# Patient Record
Sex: Male | Born: 1965
Health system: Southern US, Community
[De-identification: ages and names within clinical notes are randomized; demographics above are authoritative.]

## PROBLEM LIST (undated history)

## (undated) DIAGNOSIS — J449 Chronic obstructive pulmonary disease, unspecified: Secondary | ICD-10-CM

## (undated) DIAGNOSIS — I10 Essential (primary) hypertension: Secondary | ICD-10-CM

## (undated) DIAGNOSIS — E119 Type 2 diabetes mellitus without complications: Secondary | ICD-10-CM

## (undated) DIAGNOSIS — I509 Heart failure, unspecified: Secondary | ICD-10-CM

---

## 2010-12-19 DIAGNOSIS — IMO0001 Reserved for inherently not codable concepts without codable children: Secondary | ICD-10-CM | POA: Insufficient documentation

## 2011-10-18 DIAGNOSIS — Z8679 Personal history of other diseases of the circulatory system: Secondary | ICD-10-CM

## 2012-06-30 DIAGNOSIS — E119 Type 2 diabetes mellitus without complications: Secondary | ICD-10-CM

## 2013-06-24 DIAGNOSIS — D539 Nutritional anemia, unspecified: Secondary | ICD-10-CM | POA: Diagnosis present

## 2013-06-30 DIAGNOSIS — Z720 Tobacco use: Secondary | ICD-10-CM | POA: Diagnosis present

## 2015-04-26 ENCOUNTER — Encounter (HOSPITAL_COMMUNITY): Payer: Self-pay | Admitting: Emergency Medicine

## 2015-04-26 ENCOUNTER — Emergency Department (INDEPENDENT_AMBULATORY_CARE_PROVIDER_SITE_OTHER)
Admission: EM | Admit: 2015-04-26 | Discharge: 2015-04-26 | Disposition: A | Discharge status: Home / Self Care | Payer: No Typology Code available for payment source | Source: Home / Self Care | Attending: Family Medicine | Admitting: Family Medicine

## 2015-04-26 DIAGNOSIS — J069 Acute upper respiratory infection, unspecified: Secondary | ICD-10-CM

## 2015-04-26 DIAGNOSIS — IMO0001 Reserved for inherently not codable concepts without codable children: Secondary | ICD-10-CM

## 2015-04-26 DIAGNOSIS — R030 Elevated blood-pressure reading, without diagnosis of hypertension: Secondary | ICD-10-CM

## 2015-04-26 HISTORY — DX: Type 2 diabetes mellitus without complications: E11.9

## 2015-04-26 HISTORY — DX: Essential (primary) hypertension: I10

## 2015-04-26 MED ORDER — BENZONATATE 100 MG PO CAPS: 100.0000 mg | ORAL_CAPSULE | Freq: Three times a day (TID) | ORAL | Status: DC | PRN

## 2015-04-26 MED ORDER — IPRATROPIUM BROMIDE 0.06 % NA SOLN: 2.0000 | Freq: Four times a day (QID) | NASAL | Status: DC

## 2015-04-26 NOTE — ED Provider Notes (Signed)
CSN: [id number]     Arrival date & time 04/26/15  1845 History   First MD Initiated Contact with Patient 04/26/15 2015     Chief Complaint  Patient presents with  . URI   (Consider location/radiation/quality/duration/timing/severity/associated sxs/prior Treatment) HPI Comments: [place] relocated [place].  No PCP or cardiologist in local area Known hx of HTN and heart murmur. reports that he is supposed to be taking Atenolol, Spironolactone, HCTZ and Enalapril, but is currently only taking Atenolol and Spironolactone. Took his daily dose just prior to [facility]. Smoker Works as Engineer, materialssecurity officer  Patient is a 49 y.o. male presenting with URI. The history is provided by the patient.  URI Presenting symptoms: congestion, cough, fatigue, fever and rhinorrhea   Presenting symptoms: no ear pain, no facial pain and no sore throat   Severity:  Mild Onset quality:  Gradual Duration:  2 days Timing:  Constant Progression:  Unchanged Chronicity:  New Associated symptoms: myalgias   Associated symptoms: no headaches   Associated symptoms comment:  +chills, nausea   Past Medical History  Diagnosis Date  . Hypertension   . Diabetes mellitus without complication    History reviewed. No pertinent past surgical history. No family history on file. History  Substance Use Topics  . Smoking status: Never Smoker   . Smokeless tobacco: Not on file  . Alcohol Use: No    Review of Systems  Constitutional: Positive for fever, chills and fatigue.  HENT: Positive for congestion and rhinorrhea. Negative for ear pain and sore throat.   Eyes: Negative.   Respiratory: Positive for cough. Negative for chest tightness and shortness of breath.   Cardiovascular: Negative.   Gastrointestinal: Positive for nausea. Negative for vomiting, abdominal pain, diarrhea and constipation.  Genitourinary: Negative.   Musculoskeletal: Positive for myalgias. Negative for back pain.   Skin: Negative.   Neurological: Negative for dizziness, light-headedness and headaches.    Allergies  Review of patient's allergies indicates no known allergies.  Home Medications   Prior to Admission medications   Medication Sig Start Date End Date Taking? Authorizing Provider  benzonatate (TESSALON) 100 MG capsule Take 1 capsule (100 mg total) by mouth 3 (three) times daily as needed for cough. 04/26/15   Mathis FareJennifer Lee H Latoyna Hird, PA  ipratropium (ATROVENT) 0.06 % nasal spray Place 2 sprays into both nostrils 4 (four) times daily. As needed for nasal congestion 04/26/15   Jess BartersJennifer Lee H Leanna Hamid, PA   BP 194/101 mmHg  Pulse 57  Temp(Src) 98.3 F (36.8 C) (Oral)  Resp 18  SpO2 95% Physical Exam  Constitutional: He is oriented to person, place, and time. He appears well-developed and well-nourished. No distress.  HENT:  Head: Normocephalic and atraumatic.  Right Ear: Hearing, tympanic membrane, external ear and ear canal normal.  Left Ear: Hearing, tympanic membrane, external ear and ear canal normal.  Nose: Nose normal.  Mouth/Throat: Uvula is midline, oropharynx is clear and moist and mucous membranes are normal.  Eyes: Conjunctivae are normal. No scleral icterus.  Neck: Normal range of motion. Neck supple.  Cardiovascular: Normal rate and regular rhythm.   Murmur heard. 2/6 SEM  Pulmonary/Chest: Effort normal and breath sounds normal.  Musculoskeletal: Normal range of motion.  Lymphadenopathy:    He has no cervical adenopathy.  Neurological: He is alert and oriented to person, place, and time.  Skin: Skin is warm and dry.  Psychiatric: He has a normal mood and affect. His behavior is normal.  Nursing note and vitals reviewed.   ED Course  Procedures (including critical care time) Labs Review Labs Reviewed - No data to display  Imaging Review No results found.   MDM   1. URI (upper respiratory infection)   2. Elevated blood pressure     Fluids Rest Tylenol Tessalon Atrovent Take anti-HTN meds as prescribed Find PCP for follow up and routine medical management.     Mathis Fare [place], [place] 04/26/15 [phone]

## 2015-04-26 NOTE — ED Notes (Signed)
C/o cold sx onset Monday Sx include fevers, chills, nauseas, BA, cough, runny nose Alert, no signs of acute distress.

## 2015-04-26 NOTE — Discharge Instructions (Signed)
DASH Eating Plan °DASH stands for "Dietary Approaches to Stop Hypertension." The DASH eating plan is a healthy eating plan that has been shown to reduce high blood pressure (hypertension). Additional health benefits may include reducing the risk of type 2 diabetes mellitus, heart disease, and stroke. The DASH eating plan may also help with weight loss. °WHAT DO I NEED TO KNOW ABOUT THE DASH EATING PLAN? °For the DASH eating plan, you will follow these general guidelines: °· Choose foods with a percent daily value for sodium of less than 5% (as listed on the food label). °· Use salt-free seasonings or herbs instead of table salt or sea salt. °· Check with your health care provider or pharmacist before using salt substitutes. °· Eat lower-sodium products, often labeled as "lower sodium" or "no salt added." °· Eat fresh foods. °· Eat more vegetables, fruits, and low-fat dairy products. °· Choose whole grains. Look for the word "whole" as the first word in the ingredient list. °· Choose fish and skinless chicken or turkey more often than red meat. Limit fish, poultry, and meat to 6 oz (170 g) each day. °· Limit sweets, desserts, sugars, and sugary drinks. °· Choose heart-healthy fats. °· Limit cheese to 1 oz (28 g) per day. °· Eat more home-cooked food and less restaurant, buffet, and fast food. °· Limit fried foods. °· Cook foods using methods other than frying. °· Limit canned vegetables. If you do use them, rinse them well to decrease the sodium. °· When eating at a restaurant, ask that your food be prepared with less salt, or no salt if possible. °WHAT FOODS CAN I EAT? °Seek help from a dietitian for individual calorie needs. °Grains °Whole grain or whole wheat bread. Brown rice. Whole grain or whole wheat pasta. Quinoa, bulgur, and whole grain cereals. Low-sodium cereals. Corn or whole wheat flour tortillas. Whole grain cornbread. Whole grain crackers. Low-sodium crackers. °Vegetables °Fresh or frozen vegetables  (raw, steamed, roasted, or grilled). Low-sodium or reduced-sodium tomato and vegetable juices. Low-sodium or reduced-sodium tomato sauce and paste. Low-sodium or reduced-sodium canned vegetables.  °Fruits °All fresh, canned (in natural juice), or frozen fruits. °Meat and Other Protein Products °Ground beef (85% or leaner), grass-fed beef, or beef trimmed of fat. Skinless chicken or turkey. Ground chicken or turkey. Pork trimmed of fat. All fish and seafood. Eggs. Dried beans, peas, or lentils. Unsalted nuts and seeds. Unsalted canned beans. °Dairy °Low-fat dairy products, such as skim or 1% milk, 2% or reduced-fat cheeses, low-fat ricotta or cottage cheese, or plain low-fat yogurt. Low-sodium or reduced-sodium cheeses. °Fats and Oils °Tub margarines without trans fats. Light or reduced-fat mayonnaise and salad dressings (reduced sodium). Avocado. Safflower, olive, or canola oils. Natural peanut or almond butter. °Other °Unsalted popcorn and pretzels. °The items listed above may not be a complete list of recommended foods or beverages. Contact your dietitian for more options. °WHAT FOODS ARE NOT RECOMMENDED? °Grains °White bread. White pasta. White rice. Refined cornbread. Bagels and croissants. Crackers that contain trans fat. °Vegetables °Creamed or fried vegetables. Vegetables in a cheese sauce. Regular canned vegetables. Regular canned tomato sauce and paste. Regular tomato and vegetable juices. °Fruits °Dried fruits. Canned fruit in light or heavy syrup. Fruit juice. °Meat and Other Protein Products °Fatty cuts of meat. Ribs, chicken wings, bacon, sausage, bologna, salami, chitterlings, fatback, hot dogs, bratwurst, and packaged luncheon meats. Salted nuts and seeds. Canned beans with salt. °Dairy °Whole or 2% milk, cream, half-and-half, and cream cheese. Whole-fat or sweetened yogurt. Full-fat   cheeses or blue cheese. Nondairy creamers and whipped toppings. Processed cheese, cheese spreads, or cheese  curds. Condiments Onion and garlic salt, seasoned salt, table salt, and sea salt. Canned and packaged gravies. Worcestershire sauce. Tartar sauce. Barbecue sauce. Teriyaki sauce. Soy sauce, including reduced sodium. Steak sauce. Fish sauce. Oyster sauce. Cocktail sauce. Horseradish. Ketchup and mustard. Meat flavorings and tenderizers. Bouillon cubes. Hot sauce. Tabasco sauce. Marinades. Taco seasonings. Relishes. Fats and Oils Butter, stick margarine, lard, shortening, ghee, and bacon fat. Coconut, palm kernel, or palm oils. Regular salad dressings. Other Pickles and olives. Salted popcorn and pretzels. The items listed above may not be a complete list of foods and beverages to avoid. Contact your dietitian for more information. WHERE CAN I FIND MORE INFORMATION? National Heart, Lung, and Blood Institute: [email or web address] Document Released: 12/05/2011 Document Revised: 05/02/2014 Document Reviewed: 10/20/2013 [facility] Patient Information 2015 [place], [place]. This information is not intended to replace advice given to you by your health care provider. Make sure you discuss any questions you have with your health care provider.  Blood Pressure Record Sheet Your blood pressure on this visit to the emergency department or clinic is elevated. This does not necessarily mean you have high blood pressure (hypertension), but it does mean that your blood pressure needs to be rechecked. Many times your blood pressure can increase due to illness, pain, anxiety, or other factors. We recommend that you get a series of blood pressure readings done over a period of 5 days. It is best to get a reading in the morning and one in the evening. You should make sure to sit and relax for 1-5 minutes before the reading is taken. Write the readings down and make a follow-up appointment with your health care provider to discuss the results. If there is not a free clinic or a drug store with  a blood-pressure-taking machine near you, you can purchase blood-pressure-taking equipment from a drug store. Having one in the home allows you the convenience of taking your blood pressure while you are home and relaxed.  Your blood pressure in the emergency department or clinic on ________ was ____________________. BLOOD PRESSURE LOG Date: _______________________  a.m. _____________________  p.m. _____________________ Date: _______________________  a.m. _____________________  p.m. _____________________ Date: _______________________  a.m. _____________________  p.m. _____________________ Date: _______________________  a.m. _____________________  p.m. _____________________ Date: _______________________  a.m. _____________________  p.m. _____________________ Document Released: 09/14/2003 Document Revised: 05/02/2014 Document Reviewed: 02/08/2014 [employer] Patient Information 2015 [place], [place]. This information is not intended to replace advice given to you by your health care provider. Make sure you discuss any questions you have with your health care provider.  Hypertension Hypertension, commonly called high blood pressure, is when the force of blood pumping through your arteries is too strong. Your arteries are the blood vessels that carry blood from your heart throughout your body. A blood pressure reading consists of a higher number over a lower number, such as 110/72. The higher number (systolic) is the pressure inside your arteries when your heart pumps. The lower number (diastolic) is the pressure inside your arteries when your heart relaxes. Ideally you want your blood pressure below 120/80. Hypertension forces your heart to work harder to pump blood. Your arteries may become narrow or stiff. Having hypertension puts you at risk for heart disease, stroke, and other problems.  RISK FACTORS Some risk factors for high blood pressure are controllable. Others are not.  Risk  factors you cannot control include:   Race. You may be at  higher risk if you are African American.  Age. Risk increases with age.  Gender. Men are at higher risk than women before age 73 years. After age 32, women are at higher risk than men. Risk factors you can control include:  Not getting enough exercise or physical activity.  Being overweight.  Getting too much fat, sugar, calories, or salt in your diet.  Drinking too much alcohol. SIGNS AND SYMPTOMS Hypertension does not usually cause signs or symptoms. Extremely high blood pressure (hypertensive crisis) may cause headache, anxiety, shortness of breath, and nosebleed. DIAGNOSIS  To check if you have hypertension, your health care provider will measure your blood pressure while you are seated, with your arm held at the level of your heart. It should be measured at least twice using the same arm. Certain conditions can cause a difference in blood pressure between your right and left arms. A blood pressure reading that is higher than normal on one occasion does not mean that you need treatment. If one blood pressure reading is high, ask your health care provider about having it checked again. TREATMENT  Treating high blood pressure includes making lifestyle changes and possibly taking medicine. Living a healthy lifestyle can help lower high blood pressure. You may need to change some of your habits. Lifestyle changes may include:  Following the DASH diet. This diet is high in fruits, vegetables, and whole grains. It is low in salt, red meat, and added sugars.  Getting at least 2 hours of brisk physical activity every week.  Losing weight if necessary.  Not smoking.  Limiting alcoholic beverages.  Learning ways to reduce stress. If lifestyle changes are not enough to get your blood pressure under control, your health care provider may prescribe medicine. You may need to take more than one. Work closely with your health care  provider to understand the risks and benefits. HOME CARE INSTRUCTIONS  Have your blood pressure rechecked as directed by your health care provider.   Take medicines only as directed by your health care provider. Follow the directions carefully. Blood pressure medicines must be taken as prescribed. The medicine does not work as well when you skip doses. Skipping doses also puts you at risk for problems.   Do not smoke.   Monitor your blood pressure at home as directed by your health care provider. SEEK MEDICAL CARE IF:   You think you are having a reaction to medicines taken.  You have recurrent headaches or feel dizzy.  You have swelling in your ankles.  You have trouble with your vision. SEEK IMMEDIATE MEDICAL CARE IF:  You develop a severe headache or confusion.  You have unusual weakness, numbness, or feel faint.  You have severe chest or abdominal pain.  You vomit repeatedly.  You have trouble breathing. MAKE SURE YOU:   Understand these instructions.  Will watch your condition.  Will get help right away if you are not doing well or get worse. Document Released: 12/16/2005 Document Revised: 05/02/2014 Document Reviewed: 10/08/2013 [facility] Patient Information 2015 [place], [place]. This information is not intended to replace advice given to you by your health care provider. Make sure you discuss any questions you have with your health care provider.  Managing Your High Blood Pressure Blood pressure is a measurement of how forceful your blood is pressing against the walls of the arteries. Arteries are muscular tubes within the circulatory system. Blood pressure does not stay the same. Blood pressure rises when you are active, excited, or  nervous; and it lowers during sleep and relaxation. If the numbers measuring your blood pressure stay above normal most of the time, you are at risk for health problems. High blood pressure (hypertension) is a long-term (chronic)  condition in which blood pressure is elevated. A blood pressure reading is recorded as two numbers, such as 120 over 80 (or 120/80). The first, higher number is called the systolic pressure. It is a measure of the pressure in your arteries as the heart beats. The second, lower number is called the diastolic pressure. It is a measure of the pressure in your arteries as the heart relaxes between beats.  Keeping your blood pressure in a normal range is important to your overall health and prevention of health problems, such as heart disease and stroke. When your blood pressure is uncontrolled, your heart has to work harder than normal. High blood pressure is a very common condition in adults because blood pressure tends to rise with age. Men and women are equally likely to have hypertension but at different times in life. Before age 23, men are more likely to have hypertension. After 47 years of age, women are more likely to have it. Hypertension is especially common in African Americans. This condition often has no signs or symptoms. The cause of the condition is usually not known. Your caregiver can help you come up with a plan to keep your blood pressure in a normal, healthy range. BLOOD PRESSURE STAGES Blood pressure is classified into four stages: normal, prehypertension, stage 1, and stage 2. Your blood pressure reading will be used to determine what type of treatment, if any, is necessary. Appropriate treatment options are tied to these four stages:  Normal  Systolic pressure (mm Hg): below 120.  Diastolic pressure (mm Hg): below 80. Prehypertension  Systolic pressure (mm Hg): 120 to 139.  Diastolic pressure (mm Hg): 80 to 89. Stage1  Systolic pressure (mm Hg): 140 to 159.  Diastolic pressure (mm Hg): 90 to 99. Stage2  Systolic pressure (mm Hg): 160 or above.  Diastolic pressure (mm Hg): 100 or above. RISKS RELATED TO HIGH BLOOD PRESSURE Managing your blood pressure is an important  responsibility. Uncontrolled high blood pressure can lead to:  A heart attack.  A stroke.  A weakened blood vessel (aneurysm).  Heart failure.  Kidney damage.  Eye damage.  Metabolic syndrome.  Memory and concentration problems. HOW TO MANAGE YOUR BLOOD PRESSURE Blood pressure can be managed effectively with lifestyle changes and medicines (if needed). Your caregiver will help you come up with a plan to bring your blood pressure within a normal range. Your plan should include the following: Education  Read all information provided by your caregivers about how to control blood pressure.  Educate yourself on the latest guidelines and treatment recommendations. New research is always being done to further define the risks and treatments for high blood pressure. Lifestylechanges  Control your weight.  Avoid smoking.  Stay physically active.  Reduce the amount of salt in your diet.  Reduce stress.  Control any chronic conditions, such as high cholesterol or diabetes.  Reduce your alcohol intake. Medicines  Several medicines (antihypertensive medicines) are available, if needed, to bring blood pressure within a normal range. Communication  Review all the medicines you take with your caregiver because there may be side effects or interactions.  Talk with your caregiver about your diet, exercise habits, and other lifestyle factors that may be contributing to high blood pressure.  See your caregiver regularly.  Your caregiver can help you create and adjust your plan for managing high blood pressure. RECOMMENDATIONS FOR TREATMENT AND FOLLOW-UP  The following recommendations are based on current guidelines for managing high blood pressure in nonpregnant adults. Use these recommendations to identify the proper follow-up period or treatment option based on your blood pressure reading. You can discuss these options with your caregiver.  Systolic pressure of 120 to 139 or  diastolic pressure of 80 to 89: Follow up with your caregiver as directed.  Systolic pressure of 140 to 160 or diastolic pressure of 90 to 100: Follow up with your caregiver within 2 months.  Systolic pressure above 160 or diastolic pressure above 100: Follow up with your caregiver within 1 month.  Systolic pressure above 180 or diastolic pressure above 110: Consider antihypertensive therapy; follow up with your caregiver within 1 week.  Systolic pressure above 200 or diastolic pressure above 120: Begin antihypertensive therapy; follow up with your caregiver within 1 week. Document Released: 09/09/2012 Document Reviewed: 09/09/2012 [facility] Patient Information 2015 [place], [place]. This information is not intended to replace advice given to you by your health care provider. Make sure you discuss any questions you have with your health care provider.  Upper Respiratory Infection, Adult An upper respiratory infection (URI) is also sometimes known as the common cold. The upper respiratory tract includes the nose, sinuses, throat, trachea, and bronchi. Bronchi are the airways leading to the lungs. Most people improve within 1 week, but symptoms can last up to 2 weeks. A residual cough may last even longer.  CAUSES Many different viruses can infect the tissues lining the upper respiratory tract. The tissues become irritated and inflamed and often become very moist. Mucus production is also common. A cold is contagious. You can easily spread the virus to others by oral contact. This includes kissing, sharing a glass, coughing, or sneezing. Touching your mouth or nose and then touching a surface, which is then touched by another person, can also spread the virus. SYMPTOMS  Symptoms typically develop 1 to 3 days after you come in contact with a cold virus. Symptoms vary from person to person. They may include:  Runny nose.  Sneezing.  Nasal congestion.  Sinus irritation.  Sore throat.  Loss of  voice (laryngitis).  Cough.  Fatigue.  Muscle aches.  Loss of appetite.  Headache.  Low-grade fever. DIAGNOSIS  You might diagnose your own cold based on familiar symptoms, since most people get a cold 2 to 3 times a year. Your caregiver can confirm this based on your exam. Most importantly, your caregiver can check that your symptoms are not due to another disease such as strep throat, sinusitis, pneumonia, asthma, or epiglottitis. Blood tests, throat tests, and X-rays are not necessary to diagnose a common cold, but they may sometimes be helpful in excluding other more serious diseases. Your caregiver will decide if any further tests are required. RISKS AND COMPLICATIONS  You may be at risk for a more severe case of the common cold if you smoke cigarettes, have chronic heart disease (such as heart failure) or lung disease (such as asthma), or if you have a weakened immune system. The very young and very old are also at risk for more serious infections. Bacterial sinusitis, middle ear infections, and bacterial pneumonia can complicate the common cold. The common cold can worsen asthma and chronic obstructive pulmonary disease (COPD). Sometimes, these complications can require emergency medical care and may be life-threatening. PREVENTION  The best way to  protect against getting a cold is to practice good hygiene. Avoid oral or hand contact with people with cold symptoms. Wash your hands often if contact occurs. There is no clear evidence that vitamin C, vitamin E, echinacea, or exercise reduces the chance of developing a cold. However, it is always recommended to get plenty of rest and practice good nutrition. TREATMENT  Treatment is directed at relieving symptoms. There is no cure. Antibiotics are not effective, because the infection is caused by a virus, not by bacteria. Treatment may include:  Increased fluid intake. Sports drinks offer valuable electrolytes, sugars, and  fluids.  Breathing heated mist or steam (vaporizer or shower).  Eating chicken soup or other clear broths, and maintaining good nutrition.  Getting plenty of rest.  Using gargles or lozenges for comfort.  Controlling fevers with ibuprofen or acetaminophen as directed by your caregiver.  Increasing usage of your inhaler if you have asthma. Zinc gel and zinc lozenges, taken in the first 24 hours of the common cold, can shorten the duration and lessen the severity of symptoms. Pain medicines may help with fever, muscle aches, and throat pain. A variety of non-prescription medicines are available to treat congestion and runny nose. Your caregiver can make recommendations and may suggest nasal or lung inhalers for other symptoms.  HOME CARE INSTRUCTIONS   Only take over-the-counter or prescription medicines for pain, discomfort, or fever as directed by your caregiver.  Use a warm mist humidifier or inhale steam from a shower to increase air moisture. This may keep secretions moist and make it easier to breathe.  Drink enough water and fluids to keep your urine clear or pale yellow.  Rest as needed.  Return to work when your temperature has returned to normal or as your caregiver advises. You may need to stay home longer to avoid infecting others. You can also use a face mask and careful hand washing to prevent spread of the virus. SEEK MEDICAL CARE IF:   After the first few days, you feel you are getting worse rather than better.  You need your caregiver's advice about medicines to control symptoms.  You develop chills, worsening shortness of breath, or brown or red sputum. These may be signs of pneumonia.  You develop yellow or brown nasal discharge or pain in the face, especially when you bend forward. These may be signs of sinusitis.  You develop a fever, swollen neck glands, pain with swallowing, or white areas in the back of your throat. These may be signs of strep throat. SEEK  IMMEDIATE MEDICAL CARE IF:   You have a fever.  You develop severe or persistent headache, ear pain, sinus pain, or chest pain.  You develop wheezing, a prolonged cough, cough up blood, or have a change in your usual mucus (if you have chronic lung disease).  You develop sore muscles or a stiff neck. Document Released: 06/11/2001 Document Revised: 03/09/2012 Document Reviewed: 03/23/2014 [facility] Patient Information 2015 [place], [place]. This information is not intended to replace advice given to you by your health care provider. Make sure you discuss any questions you have with your health care provider.

## 2019-02-28 ENCOUNTER — Encounter (HOSPITAL_COMMUNITY): Payer: Self-pay | Admitting: Emergency Medicine

## 2019-02-28 ENCOUNTER — Emergency Department (HOSPITAL_COMMUNITY)
Admission: EM | Admit: 2019-02-28 | Discharge: 2019-02-28 | Disposition: A | Discharge status: Home / Self Care | Payer: Self-pay | Attending: Emergency Medicine | Admitting: Emergency Medicine

## 2019-02-28 ENCOUNTER — Other Ambulatory Visit: Payer: Self-pay

## 2019-02-28 ENCOUNTER — Emergency Department (HOSPITAL_COMMUNITY): Payer: Self-pay

## 2019-02-28 DIAGNOSIS — J440 Chronic obstructive pulmonary disease with acute lower respiratory infection: Secondary | ICD-10-CM | POA: Insufficient documentation

## 2019-02-28 DIAGNOSIS — J189 Pneumonia, unspecified organism: Secondary | ICD-10-CM

## 2019-02-28 DIAGNOSIS — I509 Heart failure, unspecified: Secondary | ICD-10-CM | POA: Insufficient documentation

## 2019-02-28 DIAGNOSIS — E119 Type 2 diabetes mellitus without complications: Secondary | ICD-10-CM | POA: Insufficient documentation

## 2019-02-28 DIAGNOSIS — J181 Lobar pneumonia, unspecified organism: Secondary | ICD-10-CM | POA: Insufficient documentation

## 2019-02-28 DIAGNOSIS — I110 Hypertensive heart disease with heart failure: Secondary | ICD-10-CM | POA: Insufficient documentation

## 2019-02-28 HISTORY — DX: Chronic obstructive pulmonary disease, unspecified: J44.9

## 2019-02-28 HISTORY — DX: Heart failure, unspecified: I50.9

## 2019-02-28 LAB — CBC WITH DIFFERENTIAL/PLATELET
ABS IMMATURE GRANULOCYTES: 0.02 10*3/uL (ref 0.00–0.07)
BASOS PCT: 0 %
Basophils Absolute: 0 10*3/uL (ref 0.0–0.1)
Eosinophils Absolute: 0 10*3/uL (ref 0.0–0.5)
Eosinophils Relative: 0 %
HCT: 47.5 % (ref 39.0–52.0)
Hemoglobin: 15.3 g/dL (ref 13.0–17.0)
IMMATURE GRANULOCYTES: 0 %
Lymphocytes Relative: 12 %
Lymphs Abs: 0.9 10*3/uL (ref 0.7–4.0)
MCH: 29.9 pg (ref 26.0–34.0)
MCHC: 32.2 g/dL (ref 30.0–36.0)
MCV: 93 fL (ref 80.0–100.0)
MONO ABS: 0.5 10*3/uL (ref 0.1–1.0)
Monocytes Relative: 7 %
NEUTROS PCT: 81 %
Neutro Abs: 6.5 10*3/uL (ref 1.7–7.7)
PLATELETS: 256 10*3/uL (ref 150–400)
RBC: 5.11 MIL/uL (ref 4.22–5.81)
RDW: 12.4 % (ref 11.5–15.5)
WBC: 8 10*3/uL (ref 4.0–10.5)
nRBC: 0 % (ref 0.0–0.2)

## 2019-02-28 LAB — BASIC METABOLIC PANEL
ANION GAP: 9 (ref 5–15)
BUN: 12 mg/dL (ref 6–20)
CO2: 26 mmol/L (ref 22–32)
Calcium: 8.9 mg/dL (ref 8.9–10.3)
Chloride: 101 mmol/L (ref 98–111)
Creatinine, Ser: 1.13 mg/dL (ref 0.61–1.24)
Glucose, Bld: 245 mg/dL — ABNORMAL HIGH (ref 70–99)
POTASSIUM: 3.1 mmol/L — AB (ref 3.5–5.1)
SODIUM: 136 mmol/L (ref 135–145)

## 2019-02-28 LAB — CBG MONITORING, ED: Glucose-Capillary: 188 mg/dL — ABNORMAL HIGH (ref 70–99)

## 2019-02-28 MED ORDER — POTASSIUM CHLORIDE CRYS ER 20 MEQ PO TBCR
40.0000 meq | EXTENDED_RELEASE_TABLET | Freq: Once | ORAL | Status: AC
  Administered 2019-02-28: 40 meq via ORAL
  Filled 2019-02-28: qty 2

## 2019-02-28 MED ORDER — LISINOPRIL 10 MG PO TABS: 10.0000 mg | ORAL_TABLET | Freq: Every day | ORAL | 0 refills | Status: DC

## 2019-02-28 MED ORDER — AMOXICILLIN-POT CLAVULANATE 875-125 MG PO TABS
1.0000 | ORAL_TABLET | Freq: Once | ORAL | Status: AC
  Administered 2019-02-28: 1 via ORAL
  Filled 2019-02-28: qty 1

## 2019-02-28 MED ORDER — SPIRONOLACTONE 50 MG PO TABS: 50.0000 mg | ORAL_TABLET | Freq: Every day | ORAL | 0 refills | Status: DC

## 2019-02-28 MED ORDER — ALBUTEROL SULFATE HFA 108 (90 BASE) MCG/ACT IN AERS
1.0000 | INHALATION_SPRAY | Freq: Once | RESPIRATORY_TRACT | Status: AC
  Administered 2019-02-28: 2 via RESPIRATORY_TRACT
  Filled 2019-02-28: qty 6.7

## 2019-02-28 MED ORDER — AMOXICILLIN-POT CLAVULANATE 875-125 MG PO TABS: 1.0000 | ORAL_TABLET | Freq: Two times a day (BID) | ORAL | 0 refills | Status: DC

## 2019-02-28 MED ORDER — ACETAMINOPHEN 325 MG PO TABS
650.0000 mg | ORAL_TABLET | Freq: Once | ORAL | Status: AC
  Administered 2019-02-28: 650 mg via ORAL
  Filled 2019-02-28: qty 2

## 2019-02-28 MED ORDER — IPRATROPIUM-ALBUTEROL 0.5-2.5 (3) MG/3ML IN SOLN
3.0000 mL | Freq: Once | RESPIRATORY_TRACT | Status: AC
  Administered 2019-02-28: 3 mL via RESPIRATORY_TRACT
  Filled 2019-02-28: qty 3

## 2019-02-28 MED ORDER — GLIMEPIRIDE 2 MG PO TABS: 2.0000 mg | ORAL_TABLET | Freq: Every day | ORAL | 0 refills | Status: DC

## 2019-02-28 NOTE — ED Provider Notes (Signed)
[facility] EMERGENCY DEPARTMENT Provider Note   CSN: [id number] Arrival date & time: 02/28/19  1305    History   Chief Complaint Chief Complaint  Patient presents with  . flu-like symptoms  . Hypertension  . Diarrhea    HPI Johnny Schroeder is a 53 y.o. male who presents with flu like symptoms, SOB, and diarrhea. PMH significant for non-insulin dependent DM, HTN, CHF,  hx of CAP, hx of anal cancer.  Patient states for the past 5 days he has had progressively worsening cough and shortness of breath.  He reports associated intermittent fever, sinus and chest congestion, and nonbloody diarrhea.  He does have throat pain and chest pain when he coughs but this is not constant.  He denies headache, constant chest pain, abdominal pain, nausea, vomiting, dysuria.  Nothing makes it better or worse. There is no swelling in the legs.  He has not traveled anywhere.  Since he has had a flu shot this year.  He is a smoker.  He states that he only takes the blood pressure medicines that he can afford which is metoprolol. He moved to [place] recently from [place]. He is not established with a PCP here. He is out of several of his meds.    HPI  Past Medical History:  Diagnosis Date  . Anal cancer (HCC)   . CHF (congestive heart failure) (HCC)   . COPD (chronic obstructive pulmonary disease) (HCC)   . Diabetes mellitus without complication (HCC)   . Hypertension     There are no active problems to display for this patient.   No past surgical history on file.      Home Medications    Prior to Admission medications   Medication Sig Start Date End Date Taking? Authorizing Provider  benzonatate (TESSALON) 100 MG capsule Take 1 capsule (100 mg total) by mouth 3 (three) times daily as needed for cough. 04/26/15   Presson, Jess Barters H, PA  ipratropium (ATROVENT) 0.06 % nasal spray Place 2 sprays into both nostrils 4 (four) times daily. As needed for nasal congestion  04/26/15   Presson, Mathis Fare, PA    Family History No family history on file.  Social History Social History   Tobacco Use  . Smoking status: Never Smoker  Substance Use Topics  . Alcohol use: No  . Drug use: No     Allergies   Patient has no known allergies.   Review of Systems Review of Systems  Constitutional: Positive for fever.  HENT: Positive for congestion and sinus pain. Negative for sore throat.   Respiratory: Positive for cough, shortness of breath and wheezing.   Cardiovascular: Positive for chest pain (with coughing).  Gastrointestinal: Positive for diarrhea. Negative for abdominal pain, nausea and vomiting.  Neurological: Negative for headaches.  All other systems reviewed and are negative.    Physical Exam Updated Vital Signs BP (!) 199/92 (BP Location: Left Arm)   Pulse 78   Temp (!) 100.6 F (38.1 C) (Oral)   Ht 6' (1.829 m)   Wt 83.9 kg   BMI 25.09 kg/m   Physical Exam Vitals signs and nursing note reviewed.  Constitutional:      General: He is not in acute distress.    Appearance: He is well-developed. He is ill-appearing (chronically ill appearing).  HENT:     Head: Normocephalic and atraumatic.     Right Ear: Tympanic membrane normal.     Left Ear: Tympanic membrane normal.  Nose: Mucosal edema present.     Mouth/Throat:     Lips: Pink.     Mouth: Mucous membranes are moist.     Pharynx: Oropharynx is clear.  Eyes:     General: No scleral icterus.       Right eye: No discharge.        Left eye: No discharge.     Conjunctiva/sclera: Conjunctivae normal.     Pupils: Pupils are equal, round, and reactive to light.  Neck:     Musculoskeletal: Normal range of motion and neck supple.  Cardiovascular:     Rate and Rhythm: Normal rate and regular rhythm.  Pulmonary:     Effort: Pulmonary effort is normal. Tachypnea present. No respiratory distress.     Breath sounds: Rhonchi (bibasilar rhonchi) present. No rales.  Abdominal:       General: There is no distension.     Palpations: Abdomen is soft.     Tenderness: There is no abdominal tenderness.  Musculoskeletal:     Right lower leg: No edema.     Left lower leg: No edema.  Skin:    General: Skin is warm and dry.  Neurological:     Mental Status: He is alert and oriented to person, place, and time.  Psychiatric:        Behavior: Behavior normal.      ED Treatments / Results  Labs (all labs ordered are listed, but only abnormal results are displayed) Labs Reviewed  BASIC METABOLIC PANEL - Abnormal; Notable for the following components:      Result Value   Potassium 3.1 (*)    Glucose, Bld 245 (*)    All other components within normal limits  CBG MONITORING, ED - Abnormal; Notable for the following components:   Glucose-Capillary 188 (*)    All other components within normal limits  CBC WITH DIFFERENTIAL/PLATELET    EKG EKG Interpretation  Date/Time:  Sunday February 28 2019 13:33:23 EST Ventricular Rate:  74 PR Interval:  178 QRS Duration: 160 QT Interval:  438 QTC Calculation: 486 R Axis:   -75 Text Interpretation:  Normal sinus rhythm Right bundle branch block Abnormal ECG Confirmed by Geoffery Lyons ([id number]) on 02/28/2019 1:39:58 PM   Radiology Dg Chest 2 View  Result Date: 02/28/2019 CLINICAL DATA:  Cough, fever, shortness of breath for 2 weeks. Diarrhea. EXAM: CHEST - 2 VIEW COMPARISON:  None. FINDINGS: Mild cardiomegaly noted. Mild opacity is seen in the posterior right lower lobe, suspicious for pneumonia. Left lung is clear. No evidence of pleural effusion. IMPRESSION: 1. Mild posterior right lower lobe opacity, suspicious for pneumonia. Recommend continued chest radiographic follow-up to confirm resolution. 2. Mild cardiomegaly. Electronically Signed   By: Myles Rosenthal M.D.   On: 02/28/2019 14:33    Procedures Procedures (including critical care time)  Medications Ordered in ED Medications  potassium chloride SA (K-DUR,KLOR-CON) CR  tablet 40 mEq (has no administration in time range)  amoxicillin-clavulanate (AUGMENTIN) 875-125 MG per tablet 1 tablet (has no administration in time range)  albuterol (PROVENTIL HFA;VENTOLIN HFA) 108 (90 Base) MCG/ACT inhaler 1-2 puff (has no administration in time range)  acetaminophen (TYLENOL) tablet 650 mg (650 mg Oral Given 02/28/19 1346)  ipratropium-albuterol (DUONEB) 0.5-2.5 (3) MG/3ML nebulizer solution 3 mL (3 mLs Nebulization Given 02/28/19 1346)     Initial Impression / Assessment and Plan / ED Course  I have reviewed the triage vital signs and the nursing notes.  Pertinent labs & imaging results that were  available during my care of the patient were reviewed by me and considered in my medical decision making (see chart for details).  53 year old male presents with fever, cough, SOB. He is febrile here and mildly tachypenic. BP is also markedly elevated. On review of EMR he has long standing hx of uncontrolled BP due to non-compliance. He is out of several BP meds today as well and has not seen a PCP in over a year. Will obtain labs, CXR, EKG. Will give breathing tx here.  EKG is SR with RBBB and peaked T waves. CBC is normal. BMP is remarkable for hyperglycemia (245) and mild hypokalemia (3.1). He is out of his diabetic medicine as well. Potassium was replaced here. He was ambulated in the hall and maintained his sats. CXR shows RLL pneumonia. He was given a dose of Augmentin here along with albuterol inhaler. He was given a rx for Augmentin with goodrx coupon and a refill on his meds. Referral to [facility] was given. Return precautions given.  Final Clinical Impressions(s) / ED Diagnoses   Final diagnoses:  Community acquired pneumonia of right lower lobe of lung (HCC)  Chronic obstructive pulmonary disease with acute lower respiratory infection [facility])    ED Discharge Orders    None       Bethel Born, PA-C 02/28/19 1505    Geoffery Lyons, MD 02/28/19  1742

## 2019-02-28 NOTE — Discharge Instructions (Signed)
Take Augmentin (antibiotic) twice a day for one week. Take with food Use inhaler as needed for shortness of breath and wheezing Please return if you are worsening Restart your blood pressure medicines and make an appointment with [facility]

## 2019-02-28 NOTE — ED Triage Notes (Signed)
Pt in w/flu-like symptoms x 1 wk, also hypertensive -196/92. Reports diarrhea x 2 since this am. Also c/o fever and cough, white sputum

## 2019-02-28 NOTE — ED Notes (Signed)
Patient transported to X-ray 

## 2019-02-28 NOTE — ED Notes (Signed)
Ambulated pt in hallway. Pt HR 70-75, O2 sat 95-97% on RA. Pt states not feeling SOB .

## 2019-03-03 ENCOUNTER — Emergency Department (HOSPITAL_COMMUNITY)
Admission: EM | Admit: 2019-03-03 | Discharge: 2019-03-03 | Disposition: A | Discharge status: Home / Self Care | Payer: Self-pay | Attending: Emergency Medicine | Admitting: Emergency Medicine

## 2019-03-03 ENCOUNTER — Encounter (HOSPITAL_COMMUNITY): Payer: Self-pay | Admitting: *Deleted

## 2019-03-03 ENCOUNTER — Emergency Department (HOSPITAL_COMMUNITY): Payer: Self-pay

## 2019-03-03 DIAGNOSIS — I110 Hypertensive heart disease with heart failure: Secondary | ICD-10-CM | POA: Insufficient documentation

## 2019-03-03 DIAGNOSIS — I509 Heart failure, unspecified: Secondary | ICD-10-CM | POA: Insufficient documentation

## 2019-03-03 DIAGNOSIS — E119 Type 2 diabetes mellitus without complications: Secondary | ICD-10-CM | POA: Insufficient documentation

## 2019-03-03 DIAGNOSIS — J181 Lobar pneumonia, unspecified organism: Secondary | ICD-10-CM | POA: Insufficient documentation

## 2019-03-03 DIAGNOSIS — J189 Pneumonia, unspecified organism: Secondary | ICD-10-CM

## 2019-03-03 DIAGNOSIS — F17200 Nicotine dependence, unspecified, uncomplicated: Secondary | ICD-10-CM | POA: Insufficient documentation

## 2019-03-03 DIAGNOSIS — J449 Chronic obstructive pulmonary disease, unspecified: Secondary | ICD-10-CM | POA: Insufficient documentation

## 2019-03-03 DIAGNOSIS — Z79899 Other long term (current) drug therapy: Secondary | ICD-10-CM | POA: Insufficient documentation

## 2019-03-03 LAB — CBC WITH DIFFERENTIAL/PLATELET
Abs Immature Granulocytes: 0 10*3/uL (ref 0.00–0.07)
BASOS ABS: 0.1 10*3/uL (ref 0.0–0.1)
Basophils Relative: 1 %
Eosinophils Absolute: 0 10*3/uL (ref 0.0–0.5)
Eosinophils Relative: 0 %
HEMATOCRIT: 47.2 % (ref 39.0–52.0)
HEMOGLOBIN: 15.5 g/dL (ref 13.0–17.0)
LYMPHS PCT: 27 %
Lymphs Abs: 1.7 10*3/uL (ref 0.7–4.0)
MCH: 29.8 pg (ref 26.0–34.0)
MCHC: 32.8 g/dL (ref 30.0–36.0)
MCV: 90.6 fL (ref 80.0–100.0)
MONOS PCT: 10 %
Monocytes Absolute: 0.6 10*3/uL (ref 0.1–1.0)
NEUTROS PCT: 62 %
NRBC: 0 % (ref 0.0–0.2)
NRBC: 0 /100{WBCs}
Neutro Abs: 3.9 10*3/uL (ref 1.7–7.7)
Platelets: 277 10*3/uL (ref 150–400)
RBC: 5.21 MIL/uL (ref 4.22–5.81)
RDW: 12.4 % (ref 11.5–15.5)
WBC: 6.3 10*3/uL (ref 4.0–10.5)

## 2019-03-03 LAB — BASIC METABOLIC PANEL
Anion gap: 14 (ref 5–15)
BUN: 29 mg/dL — AB (ref 6–20)
CHLORIDE: 95 mmol/L — AB (ref 98–111)
CO2: 21 mmol/L — ABNORMAL LOW (ref 22–32)
CREATININE: 1.56 mg/dL — AB (ref 0.61–1.24)
Calcium: 8.6 mg/dL — ABNORMAL LOW (ref 8.9–10.3)
GFR calc non Af Amer: 51 mL/min — ABNORMAL LOW (ref >60)
GFR, EST AFRICAN AMERICAN: 59 mL/min — AB (ref >60)
GLUCOSE: 242 mg/dL — AB (ref 70–99)
POTASSIUM: 3.2 mmol/L — AB (ref 3.5–5.1)
Sodium: 130 mmol/L — ABNORMAL LOW (ref 135–145)

## 2019-03-03 LAB — INFLUENZA PANEL BY PCR (TYPE A & B)
Influenza A By PCR: NEGATIVE
Influenza B By PCR: NEGATIVE

## 2019-03-03 LAB — LACTIC ACID, PLASMA: Lactic Acid, Venous: 1.5 mmol/L (ref 0.5–1.9)

## 2019-03-03 MED ORDER — POTASSIUM CHLORIDE CRYS ER 20 MEQ PO TBCR
40.0000 meq | EXTENDED_RELEASE_TABLET | Freq: Once | ORAL | Status: AC
  Administered 2019-03-03: 40 meq via ORAL
  Filled 2019-03-03: qty 2

## 2019-03-03 MED ORDER — SODIUM CHLORIDE 0.9 % IV BOLUS
1000.0000 mL | Freq: Once | INTRAVENOUS | Status: AC
  Administered 2019-03-03: 1000 mL via INTRAVENOUS

## 2019-03-03 MED ORDER — ACETAMINOPHEN 500 MG PO TABS
1000.0000 mg | ORAL_TABLET | Freq: Once | ORAL | Status: AC
  Administered 2019-03-03: 1000 mg via ORAL
  Filled 2019-03-03: qty 2

## 2019-03-03 MED ORDER — ALBUTEROL SULFATE HFA 108 (90 BASE) MCG/ACT IN AERS: 1.0000 | INHALATION_SPRAY | Freq: Four times a day (QID) | RESPIRATORY_TRACT | 0 refills | Status: DC | PRN

## 2019-03-03 MED ORDER — SODIUM CHLORIDE 0.9 % IV SOLN
1.0000 g | Freq: Once | INTRAVENOUS | Status: AC
  Administered 2019-03-03: 1 g via INTRAVENOUS
  Filled 2019-03-03: qty 10

## 2019-03-03 MED ORDER — IPRATROPIUM-ALBUTEROL 0.5-2.5 (3) MG/3ML IN SOLN
3.0000 mL | Freq: Once | RESPIRATORY_TRACT | Status: AC
  Administered 2019-03-03: 3 mL via RESPIRATORY_TRACT
  Filled 2019-03-03: qty 3

## 2019-03-03 MED ORDER — AZITHROMYCIN 250 MG PO TABS
500.0000 mg | ORAL_TABLET | Freq: Once | ORAL | Status: AC
  Administered 2019-03-03: 500 mg via ORAL
  Filled 2019-03-03: qty 2

## 2019-03-03 MED ORDER — POTASSIUM CHLORIDE CRYS ER 20 MEQ PO TBCR: 20.0000 meq | EXTENDED_RELEASE_TABLET | Freq: Every day | ORAL | 0 refills | Status: DC

## 2019-03-03 MED ORDER — AMOXICILLIN-POT CLAVULANATE 875-125 MG PO TABS: 1.0000 | ORAL_TABLET | Freq: Two times a day (BID) | ORAL | 0 refills | Status: DC

## 2019-03-03 MED ORDER — PREDNISONE 20 MG PO TABS: ORAL_TABLET | ORAL | 0 refills | Status: DC

## 2019-03-03 MED ORDER — AZITHROMYCIN 250 MG PO TABS: 250.0000 mg | ORAL_TABLET | Freq: Every day | ORAL | 0 refills | Status: DC

## 2019-03-03 MED ORDER — METHYLPREDNISOLONE SODIUM SUCC 125 MG IJ SOLR
125.0000 mg | Freq: Once | INTRAMUSCULAR | Status: AC
  Administered 2019-03-03: 125 mg via INTRAVENOUS
  Filled 2019-03-03: qty 2

## 2019-03-03 NOTE — ED Notes (Signed)
Patient ambulated in hallway on pulse ox spo2 stayed within normal limits 95%

## 2019-03-03 NOTE — ED Notes (Signed)
Patient verbalizes understanding of discharge instructions. Opportunity for questioning and answers were provided. Armband removed by staff, pt discharged from ED.  

## 2019-03-03 NOTE — ED Provider Notes (Signed)
[facility] EMERGENCY DEPARTMENT Provider Note   CSN: [id number] Arrival date & time: 03/03/19  1302    History   Chief Complaint Chief Complaint  Patient presents with  . Generalized Body Aches    HPI Johnny Schroeder is a 53 y.o. male.     HPI Patient states he was not able to go fill his antibiotics from his visit 3 days ago.  He was seen in the emergency department and diagnosed with pneumonia and given a dose of Augmentin.  He reports he continues to cough and have chest congestion.  He reports he feels short of breath.  He reports he has general body aches.  He reports he has been having some loose stool but no vomiting or abdominal pain.  No lower extremity swelling or calf pain.  Patient does smoke but reports he has not been able to smoke for a few days because of his symptoms. Past Medical History:  Diagnosis Date  . CHF (congestive heart failure) (HCC)   . COPD (chronic obstructive pulmonary disease) (HCC)   . Diabetes mellitus without complication (HCC)   . Hypertension     There are no active problems to display for this patient.   History reviewed. No pertinent surgical history.      Home Medications    Prior to Admission medications   Medication Sig Start Date End Date Taking? Authorizing Provider  glimepiride (AMARYL) 2 MG tablet Take 1 tablet (2 mg total) by mouth daily with breakfast. 02/28/19  Yes Bethel Born, PA-C  hydrochlorothiazide (HYDRODIURIL) 25 MG tablet Take 1 tablet by mouth daily. 03/30/18 03/30/19 Yes [provider]  metoprolol tartrate (LOPRESSOR) 25 MG tablet Take 1 tablet by mouth 2 (two) times daily.   Yes [provider]  spironolactone (ALDACTONE) 50 MG tablet Take 1 tablet (50 mg total) by mouth daily. 02/28/19  Yes Bethel Born, PA-C  albuterol (PROVENTIL HFA;VENTOLIN HFA) 108 (90 Base) MCG/ACT inhaler Inhale 1-2 puffs into the lungs every 6 (six) hours as needed for wheezing or shortness of  breath. 03/03/19   [name], MD  amoxicillin-clavulanate (AUGMENTIN) 875-125 MG tablet Take 1 tablet by mouth 2 (two) times daily. Patient not taking: Reported on 03/03/2019 02/28/19   Bethel Born, PA-C  amoxicillin-clavulanate (AUGMENTIN) 875-125 MG tablet Take 1 tablet by mouth 2 (two) times daily. One po bid x 7 days 03/03/19   [name], MD  azithromycin (ZITHROMAX) 250 MG tablet Take 1 tablet (250 mg total) by mouth daily. 03/03/19   [name], MD  benzonatate (TESSALON) 100 MG capsule Take 1 capsule (100 mg total) by mouth 3 (three) times daily as needed for cough. Patient not taking: Reported on 03/03/2019 04/26/15   Ria Clock, PA  ipratropium (ATROVENT) 0.06 % nasal spray Place 2 sprays into both nostrils 4 (four) times daily. As needed for nasal congestion Patient not taking: Reported on 03/03/2019 04/26/15   Ria Clock, PA  potassium chloride SA (K-DUR,KLOR-CON) 20 MEQ tablet Take 1 tablet (20 mEq total) by mouth daily. 03/03/19   [name], MD  predniSONE (DELTASONE) 20 MG tablet 3 tabs po day one, then 2 tabs daily x 4 days 03/03/19   [name], MD    Family History No family history on file.  Social History Social History   Tobacco Use  . Smoking status: Current Every Day Smoker    Packs/day: 1.00  . Smokeless tobacco: Never Used  Substance Use Topics  .  Alcohol use: No  . Drug use: No     Allergies   Lactose and Lisinopril   Review of Systems Review of Systems  10 Systems reviewed and are negative for acute change except as noted in the HPI. Physical Exam Updated Vital Signs BP 105/81 (BP Location: Left Arm)   Pulse 78   Temp 98.1 F (36.7 C) (Oral)   Resp 14   Ht 6' (1.829 m)   Wt 83.9 kg   SpO2 95%   BMI 25.09 kg/m   Physical Exam Constitutional:      Comments: Alert.  Nontoxic.  No respiratory distress at rest.  Well-nourished well-developed.  HENT:     Head: Normocephalic and atraumatic.     Nose:  Nose normal.     Mouth/Throat:     Comments: Throat has diffuse erythema over the tonsillar pillars but no tonsillar enlargement and no exudates.  Mucous membranes are moist. Eyes:     Extraocular Movements: Extraocular movements intact.  Neck:     Musculoskeletal: Neck supple.  Cardiovascular:     Comments: Heart rate is normal.  2\6 systolic ejection murmur.  Distal pulses symmetric. Pulmonary:     Comments: No respiratory distress at rest.  Occasional paroxysmal harsh cough.  Diffuse wheezing throughout lung fields.  Adequate airflow to bases. Abdominal:     General: There is no distension.     Palpations: Abdomen is soft.     Tenderness: There is no abdominal tenderness. There is no guarding.  Musculoskeletal: Normal range of motion.        General: No swelling or tenderness.     Right lower leg: No edema.     Left lower leg: No edema.  Skin:    General: Skin is warm and dry.  Neurological:     General: No focal deficit present.     Mental Status: He is oriented to person, place, and time.     Coordination: Coordination normal.  Psychiatric:        Mood and Affect: Mood normal.      ED Treatments / Results  Labs (all labs ordered are listed, but only abnormal results are displayed) Labs Reviewed  BASIC METABOLIC PANEL - Abnormal; Notable for the following components:      Result Value   Sodium 130 (*)    Potassium 3.2 (*)    Chloride 95 (*)    CO2 21 (*)    Glucose, Bld 242 (*)    BUN 29 (*)    Creatinine, Ser 1.56 (*)    Calcium 8.6 (*)    GFR calc non Af Amer 51 (*)    GFR calc Af Amer 59 (*)    All other components within normal limits  CULTURE, BLOOD (ROUTINE X 2)  CULTURE, BLOOD (ROUTINE X 2)  CBC WITH DIFFERENTIAL/PLATELET  LACTIC ACID, PLASMA  INFLUENZA PANEL BY PCR (TYPE A & B)  LACTIC ACID, PLASMA    EKG None  Radiology Dg Chest 2 View  Result Date: 03/03/2019 CLINICAL DATA:  Cough and dyspnea with fever EXAM: CHEST - 2 VIEW COMPARISON:   02/28/2019 FINDINGS: Partial clearing of right lower lobe airspace disease. No new pulmonary consolidation. Pulmonary hyperinflation with diffuse interstitial prominence and cardiomegaly remain stable. No acute nor suspicious osseous abnormality. IMPRESSION: Partial clearing of right lower lobe airspace disease felt to represent pneumonia. It should be noted that pneumonia may take several weeks to clear radiographically and may lag clinical improvement. Stable cardiomegaly. Electronically Signed  By: Tollie Eth M.D.   On: 03/03/2019 15:09    Procedures Procedures (including critical care time)  Medications Ordered in ED Medications  ipratropium-albuterol (DUONEB) 0.5-2.5 (3) MG/3ML nebulizer solution 3 mL (3 mLs Nebulization Given 03/03/19 1330)  acetaminophen (TYLENOL) tablet 1,000 mg (1,000 mg Oral Given 03/03/19 1330)  sodium chloride 0.9 % bolus 1,000 mL (0 mLs Intravenous Stopped 03/03/19 1800)  ipratropium-albuterol (DUONEB) 0.5-2.5 (3) MG/3ML nebulizer solution 3 mL (3 mLs Nebulization Given 03/03/19 1611)  methylPREDNISolone sodium succinate (SOLU-MEDROL) 125 mg/2 mL injection 125 mg (125 mg Intravenous Given 03/03/19 1606)  potassium chloride SA (K-DUR,KLOR-CON) CR tablet 40 mEq (40 mEq Oral Given 03/03/19 1610)  cefTRIAXone (ROCEPHIN) 1 g in sodium chloride 0.9 % 100 mL IVPB (0 g Intravenous Stopped 03/03/19 1657)  azithromycin (ZITHROMAX) tablet 500 mg (500 mg Oral Given 03/03/19 1611)     Initial Impression / Assessment and Plan / ED Course  I have reviewed the triage vital signs and the nursing notes.  Pertinent labs & imaging results that were available during my care of the patient were reviewed by me and considered in my medical decision making (see chart for details).        Patient presents not feeling improved after evaluation 2 days ago with diagnosis of a community-acquired pneumonia.  Patient reports he did not fill his antibiotics.  He has not had treatment since leaving the  hospital.  He continues to have body aches and harsh cough.  Influenza did test negative.  Previous chest x-ray suggested right lower lobe pneumonia.  Today's chest x-ray shows some improvement of that, no elevation of white blood cell count and vital signs are stable.  Patient does have diffuse wheezing through the lung fields.  He has COPD with smoking history.  I suspect that he is having a COPD exacerbation in conjunction with a pneumonia versus bronchitis.  Patient will be treated with prednisone course as well as antibiotics.  At this time, he continues to appear stable for outpatient treatment.  He is encouraged to use the good Rx for prescriptions and to fill his antibiotics.  Final Clinical Impressions(s) / ED Diagnoses   Final diagnoses:  Community acquired pneumonia of right lower lobe of lung [facility])    ED Discharge Orders         Ordered    amoxicillin-clavulanate (AUGMENTIN) 875-125 MG tablet  2 times daily     03/03/19 1853    azithromycin (ZITHROMAX) 250 MG tablet  Daily     03/03/19 1853    predniSONE (DELTASONE) 20 MG tablet     03/03/19 1853    albuterol (PROVENTIL HFA;VENTOLIN HFA) 108 (90 Base) MCG/ACT inhaler  Every 6 hours PRN     03/03/19 1853    potassium chloride SA (K-DUR,KLOR-CON) 20 MEQ tablet  Daily     03/03/19 1853           [name], MD 03/03/19 1901

## 2019-03-03 NOTE — ED Triage Notes (Signed)
Pt in c/o upper respiratory symptoms, nasal congestion, body aches onset x 1 wk, pt A&O x4

## 2019-03-04 ENCOUNTER — Telehealth (HOSPITAL_BASED_OUTPATIENT_CLINIC_OR_DEPARTMENT_OTHER): Payer: Self-pay | Admitting: Emergency Medicine

## 2019-03-04 LAB — BLOOD CULTURE ID PANEL (REFLEXED)
Acinetobacter baumannii: NOT DETECTED
Candida albicans: NOT DETECTED
Candida glabrata: NOT DETECTED
Candida krusei: NOT DETECTED
Candida parapsilosis: NOT DETECTED
Candida tropicalis: NOT DETECTED
Enterobacter cloacae complex: NOT DETECTED
Enterobacteriaceae species: NOT DETECTED
Enterococcus species: NOT DETECTED
Escherichia coli: NOT DETECTED
HAEMOPHILUS INFLUENZAE: NOT DETECTED
Klebsiella oxytoca: NOT DETECTED
Klebsiella pneumoniae: NOT DETECTED
Listeria monocytogenes: NOT DETECTED
METHICILLIN RESISTANCE: DETECTED — AB
Neisseria meningitidis: NOT DETECTED
Proteus species: NOT DETECTED
Pseudomonas aeruginosa: NOT DETECTED
STREPTOCOCCUS PNEUMONIAE: NOT DETECTED
Serratia marcescens: NOT DETECTED
Staphylococcus aureus (BCID): NOT DETECTED
Staphylococcus species: DETECTED — AB
Streptococcus agalactiae: NOT DETECTED
Streptococcus pyogenes: NOT DETECTED
Streptococcus species: NOT DETECTED

## 2019-03-05 ENCOUNTER — Telehealth (HOSPITAL_BASED_OUTPATIENT_CLINIC_OR_DEPARTMENT_OTHER): Payer: Self-pay | Admitting: *Deleted

## 2019-03-08 ENCOUNTER — Telehealth (HOSPITAL_BASED_OUTPATIENT_CLINIC_OR_DEPARTMENT_OTHER): Payer: Self-pay | Admitting: *Deleted

## 2019-03-08 LAB — CULTURE, BLOOD (ROUTINE X 2)
Culture: NO GROWTH
SPECIAL REQUESTS: ADEQUATE

## 2019-03-10 LAB — CULTURE, BLOOD (ROUTINE X 2): Special Requests: ADEQUATE

## 2019-03-11 ENCOUNTER — Telehealth: Payer: Self-pay

## 2019-03-11 NOTE — Telephone Encounter (Signed)
Post ED Visit - Positive Culture Follow-up  Culture report reviewed by antimicrobial stewardship pharmacist: [facility] []  Nathan Batchelder, Pharm.D. []  [address], Pharm.D., BCPS AQ-ID []  Celedonio Miyamoto, Pharm.D., BCPS []  Garvin Fila, Pharm.D., BCPS []  [place].D., BCPS, AAHIVP []  [address], Pharm.D., BCPS, AAHIVP []  Estella Husk, PharmD, BCPS []  Lysle Pearl, PharmD, BCPS [x]  Phillips Climes, PharmD, BCPS []  Agapito Games, PharmD []  Verlan Friends, PharmD, BCPS []  Mervyn Gay, PharmD  [facility] []  [facility], PharmD []  Len Childs, PharmD []  Greer Pickerel, PharmD []  Adalberto Cole, Rph []  Perlie Gold) Lonell Face, PharmD []  Jean Rosenthal, PharmD []  Earl Many, PharmD []  Junita Push, PharmD []  Dorna Leitz, PharmD []  Terrilee Files, PharmD []  Lynann Beaver, PharmD []  Keturah Barre, PharmD []  Loralee Pacas, PharmD   Positive blood culture and no further patient follow-up is required at this time. Already addressed Bernadene Person 03/11/2019, 4:25 PM

## 2019-03-22 ENCOUNTER — Inpatient Hospital Stay: Payer: Self-pay | Admitting: Pulmonary Disease

## 2019-06-10 ENCOUNTER — Ambulatory Visit (INDEPENDENT_AMBULATORY_CARE_PROVIDER_SITE_OTHER): Payer: Self-pay | Admitting: Pharmacist

## 2019-06-10 ENCOUNTER — Other Ambulatory Visit: Payer: Self-pay

## 2019-06-10 DIAGNOSIS — Z7252 High risk homosexual behavior: Secondary | ICD-10-CM

## 2019-06-10 NOTE — Progress Notes (Signed)
Date:  06/11/2019   HPI: Johnny Schroeder is a 53 y.o. male who presents to the [facility] clinic to discuss and initiate PrEP.  Insured   []    Uninsured  [x]    There are no active problems to display for this patient.   Patient's Medications  New Prescriptions   No medications on file  Previous Medications   ALBUTEROL (PROVENTIL HFA;VENTOLIN HFA) 108 (90 BASE) MCG/ACT INHALER    Inhale 1-2 puffs into the lungs every 6 (six) hours as needed for wheezing or shortness of breath.   AMOXICILLIN-CLAVULANATE (AUGMENTIN) 875-125 MG TABLET    Take 1 tablet by mouth 2 (two) times daily.   AMOXICILLIN-CLAVULANATE (AUGMENTIN) 875-125 MG TABLET    Take 1 tablet by mouth 2 (two) times daily. One po bid x 7 days   AZITHROMYCIN (ZITHROMAX) 250 MG TABLET    Take 1 tablet (250 mg total) by mouth daily.   BENZONATATE (TESSALON) 100 MG CAPSULE    Take 1 capsule (100 mg total) by mouth 3 (three) times daily as needed for cough.   GLIMEPIRIDE (AMARYL) 2 MG TABLET    Take 1 tablet (2 mg total) by mouth daily with breakfast.   HYDROCHLOROTHIAZIDE (HYDRODIURIL) 25 MG TABLET    Take 1 tablet by mouth daily.   IPRATROPIUM (ATROVENT) 0.06 % NASAL SPRAY    Place 2 sprays into both nostrils 4 (four) times daily. As needed for nasal congestion   METOPROLOL TARTRATE (LOPRESSOR) 25 MG TABLET    Take 1 tablet by mouth 2 (two) times daily.   POTASSIUM CHLORIDE SA (K-DUR,KLOR-CON) 20 MEQ TABLET    Take 1 tablet (20 mEq total) by mouth daily.   PREDNISONE (DELTASONE) 20 MG TABLET    3 tabs po day one, then 2 tabs daily x 4 days   SPIRONOLACTONE (ALDACTONE) 50 MG TABLET    Take 1 tablet (50 mg total) by mouth daily.  Modified Medications   No medications on file  Discontinued Medications   No medications on file    Allergies: Allergies  Allergen Reactions  . Lactose Other (See Comments)    GI upset  . Lisinopril Diarrhea and Other (See Comments)    Joint pain    Past Medical History: Past Medical History:   Diagnosis Date  . CHF (congestive heart failure) (HCC)   . COPD (chronic obstructive pulmonary disease) (HCC)   . Diabetes mellitus without complication (HCC)   . Hypertension     Social History: Social History   Socioeconomic History  . Marital status: Divorced    Spouse name: Not on file  . Number of children: Not on file  . Years of education: Not on file  . Highest education level: Not on file  Occupational History  . Not on file  Social Needs  . Financial resource strain: Not on file  . Food insecurity    Worry: Not on file    Inability: Not on file  . Transportation needs    Medical: Not on file    Non-medical: Not on file  Tobacco Use  . Smoking status: Current Every Day Smoker    Packs/day: 1.00  . Smokeless tobacco: Never Used  Substance and Sexual Activity  . Alcohol use: No  . Drug use: No  . Sexual activity: Not on file  Lifestyle  . Physical activity    Days per week: Not on file    Minutes per session: Not on file  . Stress: Not on file  Relationships  . Social Herbalist on phone: Not on file    Gets together: Not on file    Attends religious service: Not on file    Active member of club or organization: Not on file    Attends meetings of clubs or organizations: Not on file    Relationship status: Not on file  Other Topics Concern  . Not on file  Social History Narrative  . Not on file    CHL HIV PREP FLOWSHEET RESULTS 06/11/2019  Insurance Status Uninsured  Gender at birth Male  Gender identity cis-Male  Risk for HIV In sexual relationship with HIV+ partner;Condomless vaginal or anal intercourse  Sex Partners Men only  # sex partners past 3-6 mos 1-3  Sex activity preferences Insertive  Condom use No  Treated for STI? No  HIV symptoms? N/A    Labs:  SCr: Lab Results  Component Value Date   CREATININE 1.21 06/10/2019   CREATININE 1.56 (H) 03/03/2019   CREATININE 1.13 02/28/2019   HIV Lab Results  Component Value  Date   HIV NON-REACTIVE 06/10/2019   Hepatitis B No results found for: HEPBSAB, HEPBSAG, HEPBCAB Hepatitis C No results found for: HEPCAB, HCVRNAPCRQN Hepatitis A No results found for: HAV RPR and STI No results found for: LABRPR, RPRTITER  No flowsheet data found.  Assessment: Johnny Schroeder is here today with his partner who is being seen by Dr. Johnnye Schroeder for newly diagnosed HIV. He is interested in starting PrEP. His partner has not started on ART yet, so I advised them to use condoms or abstain from sexual activity until we can get him on PrEP and his partner is undetectable. Will get labs today and start him on [name].    Counseled patient that [name] is a one pill once daily regimen with or without food that can prevent HIV. Discussed the importance of taking the medication daily to provide protection and decreased adherence is associated with decreased efficacy. Also discussed how [name] works to prevent HIV but not other STDs and encouraged the use of condoms. Counseled on what to do if dose is missed, if closer to missed dose take immediately, if closer to next dose then skip and resume normal schedule.  Counseled patient that [name] is normally well tolerated, however some patients experience a "start up syndrome" with nausea, diarrhea, dizziness, and fatigue but that those should resolve soon after starting.  Advised that any nausea can be mitigated by taking it with food. I reviewed patient medications and found no drug interactions. Discussed how our PrEP process works here at the clinic including follow ups and lab monitoring every 3 months.  He is uninsured, so will proceed with getting him approved for patient assistance through [place].  Will make his follow-up appointment when he is approved and starts treatment.  Plan: - HIV antibody, BMET, RPR, urine gonorrhea/chlamydia cytology, hepatitis B surface antigen, hepatitis B surface antibody today - [name] x 3 months if HIV  negative - F/u with me 3 months after starting  Johnny Schroeder, PharmD, BCIDP, AAHIVP, [place] for Infectious Disease 06/11/2019, 11:21 AM

## 2019-06-11 LAB — BASIC METABOLIC PANEL
BUN: 16 mg/dL (ref 7–25)
CO2: 23 mmol/L (ref 20–32)
Calcium: 9.3 mg/dL (ref 8.6–10.3)
Chloride: 106 mmol/L (ref 98–110)
Creat: 1.21 mg/dL (ref 0.70–1.33)
Glucose, Bld: 123 mg/dL — ABNORMAL HIGH (ref 65–99)
Potassium: 4.4 mmol/L (ref 3.5–5.3)
Sodium: 138 mmol/L (ref 135–146)

## 2019-06-11 LAB — URINE CYTOLOGY ANCILLARY ONLY
Chlamydia: NEGATIVE
Neisseria Gonorrhea: NEGATIVE

## 2019-06-11 LAB — HIV ANTIBODY (ROUTINE TESTING W REFLEX): HIV 1&2 Ab, 4th Generation: NONREACTIVE

## 2019-06-11 LAB — HEPATITIS B SURFACE ANTIBODY,QUALITATIVE: Hep B S Ab: NONREACTIVE

## 2019-06-11 LAB — HEPATITIS B SURFACE ANTIGEN: Hepatitis B Surface Ag: NONREACTIVE

## 2019-06-14 ENCOUNTER — Telehealth: Payer: Self-pay | Admitting: Pharmacy Technician

## 2019-06-14 ENCOUNTER — Telehealth: Payer: Self-pay | Admitting: Pharmacist

## 2019-06-14 DIAGNOSIS — Z7252 High risk homosexual behavior: Secondary | ICD-10-CM

## 2019-06-14 MED ORDER — DESCOVY 200-25 MG PO TABS: 1.0000 | ORAL_TABLET | Freq: Every day | ORAL | 2 refills | Status: DC

## 2019-06-14 MED FILL — DESCOVY 200-25 MG TABS: 200-25 | 30 days supply | Qty: 30 | Fill #0

## 2019-06-14 NOTE — Telephone Encounter (Addendum)
[facility] Patient Advocate Encounter   Patient has been approved for [employer] Advancing Access Patient Assistance Program for [name] for 30 day fill. This assistance will make the patient's copay $0.  I have spoke with the patient and patient would like to pick up in person at the pharmacy. The billing information is as follows and has been shared with [place].  Member ID: [id number] [place]: [id number] PCN: [id number] Group: [id number]  Patient was informed of this information at his appointment. A full application was filled out and faxed to [facility] but requires proof of income before they will process the application. He prefers to drop off the income documentation at the clinic. Will continue to follow-up to get this information before his next refill.  Bartholomew Crews, CPhT Specialty Pharmacy Patient [facility] for Infectious Disease Phone: [phone] Fax: [phone] 06/14/2019 9:40 AM

## 2019-06-14 NOTE — Telephone Encounter (Signed)
Patient returned call and refused to wait to be transferred. Advised him of Cassie message and need for proof of income and he advised he will bring information by end of week. Advised him to do so as soon as possible.

## 2019-06-14 NOTE — Telephone Encounter (Signed)
Called patient to let him know that his HIV antibody was negative - no answer, no VM set up.  Will send in 3 months of [name].  He is conditionally approved for 30 days of [name] through [place].  He will need to bring in proof of income to be approved for longer.  Adonis Brook will continue to reach out to get him set up at our pharmacy.

## 2019-06-18 NOTE — Telephone Encounter (Signed)
[employer] Advancing Access approved the patient to receive medication from 06/14/2019 until 12/13/2019. Billing information is listed below and has been updated with the pharmacy.

## 2019-07-08 MED FILL — DESCOVY 200-25 MG TABS: 200-25 | 30 days supply | Qty: 30 | Fill #1

## 2019-08-05 MED FILL — DESCOVY 200-25 MG TABS: 200-25 | 30 days supply | Qty: 30 | Fill #2

## 2019-09-10 ENCOUNTER — Telehealth: Payer: Self-pay | Admitting: *Deleted

## 2019-09-10 NOTE — Telephone Encounter (Signed)
Patient left message in triage asking about an upcoming appointment. Nothing seem to be scheduled, but patient is in need of 3 month PrEP follow up with Cassie/pharmacy.  RN attempted to return call, but it was not answered and patient does not have voicemail set up. Landis Gandy, RN

## 2019-09-22 ENCOUNTER — Ambulatory Visit: Payer: Self-pay | Admitting: Pharmacist

## 2019-09-27 ENCOUNTER — Ambulatory Visit (INDEPENDENT_AMBULATORY_CARE_PROVIDER_SITE_OTHER): Payer: Self-pay | Admitting: Pharmacist

## 2019-09-27 ENCOUNTER — Other Ambulatory Visit: Payer: Self-pay

## 2019-09-27 DIAGNOSIS — Z7252 High risk homosexual behavior: Secondary | ICD-10-CM

## 2019-09-27 NOTE — Progress Notes (Signed)
Date:  09/29/2019   HPI: Johnny Schroeder is a 53 y.o. male who presents to the [facility] pharmacy clinic for 3 month PrEP follow-up.  Insured   []    Uninsured  [x]    There are no active problems to display for this patient.   Patient's Medications  New Prescriptions   No medications on file  Previous Medications   ALBUTEROL (PROVENTIL HFA;VENTOLIN HFA) 108 (90 BASE) MCG/ACT INHALER    Inhale 1-2 puffs into the lungs every 6 (six) hours as needed for wheezing or shortness of breath.   AMOXICILLIN-CLAVULANATE (AUGMENTIN) 875-125 MG TABLET    Take 1 tablet by mouth 2 (two) times daily.   AMOXICILLIN-CLAVULANATE (AUGMENTIN) 875-125 MG TABLET    Take 1 tablet by mouth 2 (two) times daily. One po bid x 7 days   AZITHROMYCIN (ZITHROMAX) 250 MG TABLET    Take 1 tablet (250 mg total) by mouth daily.   BENZONATATE (TESSALON) 100 MG CAPSULE    Take 1 capsule (100 mg total) by mouth 3 (three) times daily as needed for cough.   GLIMEPIRIDE (AMARYL) 2 MG TABLET    Take 1 tablet (2 mg total) by mouth daily with breakfast.   HYDROCHLOROTHIAZIDE (HYDRODIURIL) 25 MG TABLET    Take 1 tablet by mouth daily.   IPRATROPIUM (ATROVENT) 0.06 % NASAL SPRAY    Place 2 sprays into both nostrils 4 (four) times daily. As needed for nasal congestion   METOPROLOL TARTRATE (LOPRESSOR) 25 MG TABLET    Take 1 tablet by mouth 2 (two) times daily.   POTASSIUM CHLORIDE SA (K-DUR,KLOR-CON) 20 MEQ TABLET    Take 1 tablet (20 mEq total) by mouth daily.   PREDNISONE (DELTASONE) 20 MG TABLET    3 tabs po day one, then 2 tabs daily x 4 days   SPIRONOLACTONE (ALDACTONE) 50 MG TABLET    Take 1 tablet (50 mg total) by mouth daily.  Modified Medications   Modified Medication Previous Medication   EMTRICITABINE-TENOFOVIR AF ([name]) 200-25 MG TABLET emtricitabine-tenofovir AF ([name]) 200-25 MG tablet      Take 1 tablet by mouth daily.    Take 1 tablet by mouth daily.  Discontinued Medications   No medications on file    Allergies:  Allergies  Allergen Reactions  . Lactose Other (See Comments)    GI upset  . Lisinopril Diarrhea and Other (See Comments)    Joint pain    Past Medical History: Past Medical History:  Diagnosis Date  . CHF (congestive heart failure) (HCC)   . COPD (chronic obstructive pulmonary disease) (HCC)   . Diabetes mellitus without complication (HCC)   . Hypertension     Social History: Social History   Socioeconomic History  . Marital status: Divorced    Spouse name: Not on file  . Number of children: Not on file  . Years of education: Not on file  . Highest education level: Not on file  Occupational History  . Not on file  Social Needs  . Financial resource strain: Not on file  . Food insecurity    Worry: Not on file    Inability: Not on file  . Transportation needs    Medical: Not on file    Non-medical: Not on file  Tobacco Use  . Smoking status: Current Every Day Smoker    Packs/day: 1.00  . Smokeless tobacco: Never Used  Substance and Sexual Activity  . Alcohol use: No  . Drug use: No  . Sexual activity:  Not on file  Lifestyle  . Physical activity    Days per week: Not on file    Minutes per session: Not on file  . Stress: Not on file  Relationships  . Social Herbalist on phone: Not on file    Gets together: Not on file    Attends religious service: Not on file    Active member of club or organization: Not on file    Attends meetings of clubs or organizations: Not on file    Relationship status: Not on file  Other Topics Concern  . Not on file  Social History Narrative  . Not on file    CHL HIV PREP FLOWSHEET RESULTS 09/29/2019 06/11/2019  Insurance Status Uninsured Uninsured  Gender at birth Male Male  Gender identity cis-Male cis-Male  Risk for HIV In sexual relationship with HIV+ partner;Condomless vaginal or anal intercourse In sexual relationship with HIV+ partner;Condomless vaginal or anal intercourse  Sex Partners Men only Men only  #  sex partners past 3-6 mos 1-3 1-3  Sex activity preferences Insertive Insertive  Condom use No No  Treated for STI? No No  HIV symptoms? N/A N/A  PrEP Eligibility Substantial risk for HIV -    Labs:  SCr: Lab Results  Component Value Date   CREATININE 1.25 09/27/2019   CREATININE 1.21 06/10/2019   CREATININE 1.56 (H) 03/03/2019   CREATININE 1.13 02/28/2019   HIV Lab Results  Component Value Date   HIV NON-REACTIVE 09/27/2019   HIV NON-REACTIVE 06/10/2019   Hepatitis B Lab Results  Component Value Date   HEPBSAB NON-REACTIVE 06/10/2019   HEPBSAG NON-REACTIVE 06/10/2019   Hepatitis C No results found for: HEPCAB, HCVRNAPCRQN Hepatitis A No results found for: HAV RPR and STI No results found for: LABRPR, RPRTITER  STI Results GC CT  06/10/2019 Negative Negative    Assessment: Johnny Schroeder is here for PrEP follow-up and labs.  He missed a few earlier appointments with me and because of this, he has been out of his [name] x 2 weeks.  He has not had any problems taking it or with side effects when he was taking it.  He is uninsured and gets his [name] from [place].  He is in a relationship with a HIV positive patient who is actually not on ART yet.  We saw his partner several months ago and he was supposed to start B and E but has yet to do so.  He states it is because he has had some issues to deal with and feels fine.  He is seeing Dr. Baxter Flattery today.    He is the top partner when they have intercourse but they do not use condoms.  Encouraged him to abstain from any sexual activity until his partner gets on ART and at the very least, use condoms and take his [name].  He understands the risk.  Will check labs today and see him again in 3 months.  Of note, he is living at the [facility] right now until further notice.  Plan: - HIV antibody and BMET today - [name] x 3 months if HIV negative - F/u with me again 12/23 at 230pm  [name]. [name], PharmD, BCIDP,  AAHIVP, [place] for Infectious Disease 09/29/2019, 10:41 AM

## 2019-09-28 ENCOUNTER — Ambulatory Visit: Payer: Self-pay | Admitting: Pharmacist

## 2019-09-28 LAB — BASIC METABOLIC PANEL
BUN: 19 mg/dL (ref 7–25)
CO2: 27 mmol/L (ref 20–32)
Calcium: 9 mg/dL (ref 8.6–10.3)
Chloride: 107 mmol/L (ref 98–110)
Creat: 1.25 mg/dL (ref 0.70–1.33)
Glucose, Bld: 190 mg/dL — ABNORMAL HIGH (ref 65–99)
Potassium: 4.5 mmol/L (ref 3.5–5.3)
Sodium: 140 mmol/L (ref 135–146)

## 2019-09-28 LAB — HIV ANTIBODY (ROUTINE TESTING W REFLEX): HIV 1&2 Ab, 4th Generation: NONREACTIVE

## 2019-09-29 ENCOUNTER — Other Ambulatory Visit: Payer: Self-pay | Admitting: Pharmacist

## 2019-09-29 MED ORDER — DESCOVY 200-25 MG PO TABS: 1.0000 | ORAL_TABLET | Freq: Every day | ORAL | 2 refills | Status: DC

## 2019-09-29 MED FILL — DESCOVY 200-25 MG TABS: 200-25 | 30 days supply | Qty: 30 | Fill #0

## 2019-09-29 NOTE — Progress Notes (Signed)
Patient's HIV antibody is negative.  Will send in 3 more months of [name] to [facility].  

## 2019-09-30 ENCOUNTER — Emergency Department (HOSPITAL_COMMUNITY)
Admission: EM | Admit: 2019-09-30 | Discharge: 2019-09-30 | Disposition: A | Discharge status: Home / Self Care | Payer: Self-pay | Attending: Emergency Medicine | Admitting: Emergency Medicine

## 2019-09-30 ENCOUNTER — Emergency Department (HOSPITAL_COMMUNITY): Payer: Self-pay

## 2019-09-30 ENCOUNTER — Encounter (HOSPITAL_COMMUNITY): Payer: Self-pay

## 2019-09-30 ENCOUNTER — Other Ambulatory Visit: Payer: Self-pay

## 2019-09-30 DIAGNOSIS — R0789 Other chest pain: Secondary | ICD-10-CM

## 2019-09-30 DIAGNOSIS — E119 Type 2 diabetes mellitus without complications: Secondary | ICD-10-CM | POA: Insufficient documentation

## 2019-09-30 DIAGNOSIS — F17210 Nicotine dependence, cigarettes, uncomplicated: Secondary | ICD-10-CM | POA: Insufficient documentation

## 2019-09-30 DIAGNOSIS — I110 Hypertensive heart disease with heart failure: Secondary | ICD-10-CM | POA: Insufficient documentation

## 2019-09-30 DIAGNOSIS — I10 Essential (primary) hypertension: Secondary | ICD-10-CM | POA: Insufficient documentation

## 2019-09-30 DIAGNOSIS — I509 Heart failure, unspecified: Secondary | ICD-10-CM | POA: Insufficient documentation

## 2019-09-30 DIAGNOSIS — Z79899 Other long term (current) drug therapy: Secondary | ICD-10-CM | POA: Insufficient documentation

## 2019-09-30 DIAGNOSIS — R55 Syncope and collapse: Secondary | ICD-10-CM | POA: Insufficient documentation

## 2019-09-30 DIAGNOSIS — J449 Chronic obstructive pulmonary disease, unspecified: Secondary | ICD-10-CM | POA: Insufficient documentation

## 2019-09-30 DIAGNOSIS — R778 Other specified abnormalities of plasma proteins: Secondary | ICD-10-CM | POA: Insufficient documentation

## 2019-09-30 LAB — BASIC METABOLIC PANEL
Anion gap: 14 (ref 5–15)
BUN: 17 mg/dL (ref 6–20)
CO2: 23 mmol/L (ref 22–32)
Calcium: 9.2 mg/dL (ref 8.9–10.3)
Chloride: 101 mmol/L (ref 98–111)
Creatinine, Ser: 1.13 mg/dL (ref 0.61–1.24)
GFR calc Af Amer: >60 mL/min (ref >60)
GFR calc non Af Amer: >60 mL/min (ref >60)
Glucose, Bld: 146 mg/dL — ABNORMAL HIGH (ref 70–99)
Potassium: 3.8 mmol/L (ref 3.5–5.1)
Sodium: 138 mmol/L (ref 135–145)

## 2019-09-30 LAB — CBC
HCT: 45.6 % (ref 39.0–52.0)
Hemoglobin: 15.4 g/dL (ref 13.0–17.0)
MCH: 33.2 pg (ref 26.0–34.0)
MCHC: 33.8 g/dL (ref 30.0–36.0)
MCV: 98.3 fL (ref 80.0–100.0)
Platelets: 271 10*3/uL (ref 150–400)
RBC: 4.64 MIL/uL (ref 4.22–5.81)
RDW: 12.8 % (ref 11.5–15.5)
WBC: 4 10*3/uL (ref 4.0–10.5)
nRBC: 0 % (ref 0.0–0.2)

## 2019-09-30 LAB — URINALYSIS, ROUTINE W REFLEX MICROSCOPIC
Bacteria, UA: NONE SEEN
Bilirubin Urine: NEGATIVE
Glucose, UA: NEGATIVE mg/dL
Hgb urine dipstick: NEGATIVE
Ketones, ur: NEGATIVE mg/dL
Leukocytes,Ua: NEGATIVE
Nitrite: NEGATIVE
Protein, ur: >=300 mg/dL — AB
Specific Gravity, Urine: 1.017 (ref 1.005–1.030)
pH: 5 (ref 5.0–8.0)

## 2019-09-30 LAB — TROPONIN I (HIGH SENSITIVITY)
Troponin I (High Sensitivity): 133 ng/L (ref <18)
Troponin I (High Sensitivity): 140 ng/L (ref <18)

## 2019-09-30 MED ORDER — LOSARTAN POTASSIUM 25 MG PO TABS: 25.0000 mg | ORAL_TABLET | Freq: Every day | ORAL | 0 refills | Status: DC

## 2019-09-30 MED ORDER — LOSARTAN POTASSIUM 50 MG PO TABS
25.0000 mg | ORAL_TABLET | Freq: Every day | ORAL | Status: DC
  Administered 2019-09-30: 18:00:00 25 mg via ORAL
  Filled 2019-09-30: qty 1

## 2019-09-30 MED ORDER — METOPROLOL TARTRATE 25 MG PO TABS
25.0000 mg | ORAL_TABLET | Freq: Once | ORAL | Status: AC
  Administered 2019-09-30: 18:00:00 25 mg via ORAL
  Filled 2019-09-30: qty 1

## 2019-09-30 MED ORDER — HYDRALAZINE HCL 10 MG PO TABS: 10.0000 mg | ORAL_TABLET | Freq: Once | ORAL | Status: DC

## 2019-09-30 MED ORDER — SODIUM CHLORIDE 0.9% FLUSH
3.0000 mL | Freq: Once | INTRAVENOUS | Status: AC
  Administered 2019-09-30: 3 mL via INTRAVENOUS

## 2019-09-30 NOTE — ED Triage Notes (Signed)
Pt reports around 3am while at work he felt dizzy and had some blurred vision, states it has been intermittent since then. Reports he thinks he blacked out while at work.  Pt denies headache. States he feels a little lightheaded but the blurred vision has improved. Pt a.o, ambulatory

## 2019-09-30 NOTE — Consult Note (Signed)
Cardiology Consultation:   Patient ID: Aodhan Scheidt MRN: [id number]; DOB: [birth year]  Admit date: 09/30/2019 Date of Consult: 09/30/2019  Primary Care Provider: Patient, No Pcp Per Primary Cardiologist: New Primary Electrophysiologist:  None    Patient Profile:   Dylen Mcelhannon is a 53 y.o. male with a hx of HTN who is being seen today for the evaluation of elevated troponin and dizziness at the request of Dr Billy Fischer.  History of Present Illness:   Mr. Jenifer 53 yo male history of HTN, DM2, COPD, and according to chart history of CHF but no other available information about this. Presents with near syncope episodes and isolated episode of chest pressure.   He is somewhat of a purposefully limited historian, appears annoted to be in the ER. Reports episode of dizziness last week while at work while walking. Lasted just a few seconds, didn't think much of it. Repeat episode early this AM while walking at work to [employer]. Dizziness and off balance, no palpitations, no syncope at that time. Later during shift he reports he was sitting at his computer and next thing he knew his partner was waking him up. Layed across computer, he does not recall what happened. Went home and went to sleep, upon waking had 4/10 chest pressure with no other associated symptoms lasting just a few seconds. Reports he has run out of his losartan at home.    WBC 4 Hgb 15.4 Plt 271 K 3.8 Cr 1.13   hstrop 133--> EKG SR, RBBB, chronic Qwaves inferior and lateral precordial leads, LVH with strain pattern PCXR pending CT head no acute process  Heart Pathway Score:     Past Medical History:  Diagnosis Date  . CHF (congestive heart failure) ([place])   . COPD (chronic obstructive pulmonary disease) ([place])   . Diabetes mellitus without complication ([place])   . Hypertension     History reviewed. No pertinent surgical history.   Inpatient Medications: Scheduled Meds: . losartan  25 mg Oral Daily    Continuous Infusions:  PRN Meds:   Allergies:    Allergies  Allergen Reactions  . Lactose Other (See Comments)    GI upset  . Lisinopril Diarrhea and Other (See Comments)    Joint pain    Social History:   Social History   Socioeconomic History  . Marital status: Divorced    Spouse name: Not on file  . Number of children: Not on file  . Years of education: Not on file  . Highest education level: Not on file  Occupational History  . Not on file  Social Needs  . Financial resource strain: Not on file  . Food insecurity    Worry: Not on file    Inability: Not on file  . Transportation needs    Medical: Not on file    Non-medical: Not on file  Tobacco Use  . Smoking status: Current Every Day Smoker    Packs/day: 1.00  . Smokeless tobacco: Never Used  Substance and Sexual Activity  . Alcohol use: No  . Drug use: Yes    Types: Marijuana  . Sexual activity: Not on file  Lifestyle  . Physical activity    Days per week: Not on file    Minutes per session: Not on file  . Stress: Not on file  Relationships  . Social Herbalist on phone: Not on file    Gets together: Not on file    Attends religious service: Not on file  Active member of club or organization: Not on file    Attends meetings of clubs or organizations: Not on file    Relationship status: Not on file  . Intimate partner violence    Fear of current or ex partner: Not on file    Emotionally abused: Not on file    Physically abused: Not on file    Forced sexual activity: Not on file  Other Topics Concern  . Not on file  Social History Narrative  . Not on file    Family History:   History reviewed. No pertinent family history.   ROS:  Please see the history of present illness.  All other ROS reviewed and negative.     Physical Exam/Data:   Vitals:   09/30/19 1830 09/30/19 1845 09/30/19 1900 09/30/19 1915  BP: (!) 184/98 (!) 196/106 (!) 207/106 (!) 211/104  Pulse: 65 64 (!) 57 66   Resp: 14 (!) 24 14 (!) 23  Temp:      TempSrc:      SpO2: 100% 97% 100% 97%   No intake or output data in the 24 hours ending 09/30/19 1926 Last 3 Weights 03/03/2019 02/28/2019  Weight (lbs) 185 lb 185 lb  Weight (kg) 83.915 kg 83.915 kg     There is no height or weight on file to calculate BMI.  General:  Well nourished, well developed, in no acute distress HEENT: normal Lymph: no adenopathy Neck: no JVD Endocrine:  No thryomegaly Vascular: No carotid bruits; FA pulses 2+ bilaterally without bruits  Cardiac:   RRR, 2/6 systoilc murmur at apex Lungs:  clear to auscultation bilaterally, no wheezing, rhonchi or rales  Abd: soft, nontender, no hepatomegaly  Ext: no edema Musculoskeletal:  No deformities, BUE and BLE strength normal and equal Skin: warm and dry  Neuro:  CNs 2-12 intact, no focal abnormalities noted Psych:  Normal affect     Laboratory Data:  High Sensitivity Troponin:   Recent Labs  Lab 09/30/19 1731  TROPONINIHS 133*     Chemistry Recent Labs  Lab 09/27/19 1125 09/30/19 1304  NA 140 138  K 4.5 3.8  CL 107 101  CO2 27 23  GLUCOSE 190* 146*  BUN 19 17  CREATININE 1.25 1.13  CALCIUM 9.0 9.2  GFRNONAA  --  >60  GFRAA  --  >60  ANIONGAP  --  14    No results for input(s): PROT, ALBUMIN, AST, ALT, ALKPHOS, BILITOT in the last 168 hours. Hematology Recent Labs  Lab 09/30/19 1304  WBC 4.0  RBC 4.64  HGB 15.4  HCT 45.6  MCV 98.3  MCH 33.2  MCHC 33.8  RDW 12.8  PLT 271   BNPNo results for input(s): BNP, PROBNP in the last 168 hours.  DDimer No results for input(s): DDIMER in the last 168 hours.   Radiology/Studies:  Ct Head Wo Contrast  Result Date: 09/30/2019 CLINICAL DATA:  Altered level of consciousness. Dizziness at work with blurred vision. EXAM: CT HEAD WITHOUT CONTRAST TECHNIQUE: Contiguous axial images were obtained from the base of the skull through the vertex without intravenous contrast. COMPARISON:  None. FINDINGS: Brain: The  ventricles are normal in size and configuration. No extra-axial fluid collections are identified. The gray-white differentiation is maintained. No CT findings for acute hemispheric infarction or intracranial hemorrhage. No mass lesions. The brainstem and cerebellum are normal. Vascular: No hyperdense vessels or obvious aneurysm. Skull: No acute skull fracture.  No bone lesion. Sinuses/Orbits: The paranasal sinuses and mastoid air  cells are clear. The globes are intact. Other: No scalp lesions, laceration or hematoma. IMPRESSION: No acute intracranial findings or mass lesions. Electronically Signed   By: Rudie Meyer M.D.   On: 09/30/2019 13:48    Assessment and Plan:   1. Dizziness/Unclear syncope - vague nonspecific history, partly because patient is not a willing historian. From history I gather overall nonspecific symptoms of dizziness, unclear if any true syncope while sitting at the computer.  - patient refused admission for further evaluation - would consider echo as outpatient given episode and abnormal ekg   2. Troponin elevation - mild, nonspecific finding thus far in setting of his severe HTN on presentation with SBPs in the 200s. EkG chronic LVH with strain pattern and inferior and anterolateral Qwaves. Certaintly severe HTN in setting of underlying severe LVH could cause a trop elevation - isolated mild episode of chest pressure after waking up, no exertional symptoms - he refuse admission for further workup. Recommended to stay at least until his second trop is back.  - would plan for outpatient echo, if recurrent pain could consider ischemic testing at outpatient  3. HTN - restart his home losartan  We will arrange outpatient follow up. Patient refused admission for further workup. If significant elevation in second trop would recommend ER staff talk to him again about staying. We will sign off     For questions or updates, please contact [facility] Please consult  [email or web address] for contact info under     Signed, Dina Rich, MD  09/30/2019 7:26 PM

## 2019-09-30 NOTE — ED Provider Notes (Signed)
[facility] EMERGENCY DEPARTMENT Provider Note   CSN: [id number] Arrival date & time: 09/30/19  1249     History   Chief Complaint Chief Complaint  Patient presents with   Dizziness   Blurred Vision    HPI Johnny Schroeder is a 53 y.o. male.     HPI  This morning had episode of near-syncope, felt very lightheaded. Had another similar episode. Both occurred between 3-6AM.  During first episode was walking, during the second was sitting, looking on the computer.  Don't think passed out all of the way, partner was there, he could hear him talking but did not want to respond, maybe 30 sec for each.  No syncope, seizure like activity, incontinence, tongue biting.  Little congested this time of year normally but no acute dyspnea/CP/n/v episode.   Eating and drinking ok. Occ etoh. No black or bloody stools.  No diarrhea/vomiting. Did have nausea with episodes.   Working first and third shift job, usually would walk to job, staying pretty active.   Hx of bad knee, not out of the normal. No numbness/weakness.  Hx of one episode of syncope prior to today, was walking through hotel and felt dyspnea, sat down and passed out.  No palpitations. Felt sort of tight in chest, mild dyspnea.   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 Also working 2 jobs.      Past Medical History:  Diagnosis Date   CHF (congestive heart failure) (HCC)    COPD (chronic obstructive pulmonary disease) (HCC)    Diabetes mellitus without complication (HCC)    Hypertension     There are no active problems to display for this patient.   History reviewed. No pertinent surgical history.      Home Medications    Prior to Admission medications   Medication Sig Start Date End  Date Taking? Authorizing Provider  albuterol (PROVENTIL HFA;VENTOLIN HFA) 108 (90 Base) MCG/ACT inhaler Inhale 1-2 puffs into the lungs every 6 (six) hours as needed for wheezing or shortness of breath. 03/03/19  Yes [name], MD  emtricitabine-tenofovir AF ([name]) 200-25 MG tablet Take 1 tablet by mouth daily. 09/29/19  Yes [name], [name], RPH-CPP  metoprolol tartrate (LOPRESSOR) 25 MG tablet Take 1 tablet by mouth 2 (two) times daily.   Yes [provider]  hydrochlorothiazide (HYDRODIURIL) 25 MG tablet Take 1 tablet by mouth daily. 03/30/18 03/30/19  [provider]  losartan (COZAAR) 25 MG tablet Take 1 tablet (25 mg total) by mouth daily. 09/30/19   Alvira Monday, MD    Family History History reviewed. No pertinent family history.  Social History Social History   Tobacco Use   Smoking status: Current Every Day Smoker    Packs/day: 1.00   Smokeless tobacco: Never Used  Substance Use Topics   Alcohol use: No   Drug use: Yes    Types: Marijuana     Allergies   Lactose and Lisinopril   Review of Systems Review of Systems  Constitutional: Negative for fever.  HENT: Positive for congestion.   Respiratory: Negative for cough.   Gastrointestinal: Positive for nausea. Negative for abdominal pain, blood in stool, constipation, diarrhea and vomiting.  Genitourinary: Negative for dysuria.  Skin: Negative for rash.  Neurological: Positive for light-headedness and headaches. Negative  for syncope, speech difficulty, weakness and numbness.     Physical Exam Updated Vital Signs BP (!) 176/94    Pulse 67    Temp 98.7 F (37.1 C) (Oral)    Resp (!) 23    SpO2 98%   Physical Exam Vitals signs and nursing note reviewed.  Constitutional:      General: He is not in acute distress.    Appearance: He is well-developed. He is not diaphoretic.  HENT:     Head: Normocephalic and atraumatic.     Left Ear: Tympanic membrane normal.  Eyes:      Conjunctiva/sclera: Conjunctivae normal.  Neck:     Musculoskeletal: Normal range of motion.  Cardiovascular:     Rate and Rhythm: Normal rate and regular rhythm.     Heart sounds: Normal heart sounds. No murmur. No friction rub. No gallop.   Pulmonary:     Effort: Pulmonary effort is normal. No respiratory distress.     Breath sounds: Normal breath sounds. No wheezing or rales.  Abdominal:     General: There is no distension.     Palpations: Abdomen is soft.     Tenderness: There is no abdominal tenderness. There is no guarding.  Skin:    General: Skin is warm and dry.  Neurological:     Mental Status: He is alert and oriented to person, place, and time.      ED Treatments / Results  Labs (all labs ordered are listed, but only abnormal results are displayed) Labs Reviewed  URINALYSIS, ROUTINE W REFLEX MICROSCOPIC - Abnormal; Notable for the following components:      Result Value   Protein, ur >=300 (*)    All other components within normal limits  BASIC METABOLIC PANEL - Abnormal; Notable for the following components:   Glucose, Bld 146 (*)    All other components within normal limits  TROPONIN I (HIGH SENSITIVITY) - Abnormal; Notable for the following components:   Troponin I (High Sensitivity) 133 (*)    All other components within normal limits  TROPONIN I (HIGH SENSITIVITY) - Abnormal; Notable for the following components:   Troponin I (High Sensitivity) 140 (*)    All other components within normal limits  CBC  CBG MONITORING, ED    EKG EKG Interpretation  Date/Time:  Thursday September 30 2019 13:10:24 EDT Ventricular Rate:  68 PR Interval:  188 QRS Duration: 154 QT Interval:  460 QTC Calculation: 489 R Axis:   -83 Text Interpretation:  Normal sinus rhythm Biatrial enlargement Left axis deviation Right bundle branch block Left ventricular hypertrophy with repolarization abnormality Inferior infarct , age undetermined Anterolateral infarct , age undetermined  Abnormal ECG No significant change since last tracing Confirmed by Gareth Morgan [phone]) on 09/30/2019 4:19:23 PM   Radiology Ct Head Wo Contrast  Result Date: 09/30/2019 CLINICAL DATA:  Altered level of consciousness. Dizziness at work with blurred vision. EXAM: CT HEAD WITHOUT CONTRAST TECHNIQUE: Contiguous axial images were obtained from the base of the skull through the vertex without intravenous contrast. COMPARISON:  None. FINDINGS: Brain: The ventricles are normal in size and configuration. No extra-axial fluid collections are identified. The gray-white differentiation is maintained. No CT findings for acute hemispheric infarction or intracranial hemorrhage. No mass lesions. The brainstem and cerebellum are normal. Vascular: No hyperdense vessels or obvious aneurysm. Skull: No acute skull fracture.  No bone lesion. Sinuses/Orbits: The paranasal sinuses and mastoid air cells are clear. The globes are intact. Other: No scalp lesions, laceration  or hematoma. IMPRESSION: No acute intracranial findings or mass lesions. Electronically Signed   By: Rudie MeyerP.  Gallerani M.D.   On: 09/30/2019 13:48    Procedures Procedures (including critical care time)  Medications Ordered in ED Medications  sodium chloride flush (NS) 0.9 % injection 3 mL (3 mLs Intravenous Given 09/30/19 1808)  metoprolol tartrate (LOPRESSOR) tablet 25 mg (25 mg Oral Given 09/30/19 1807)     Initial Impression / Assessment and Plan / ED Course  I have reviewed the triage vital signs and the nursing notes.  Pertinent labs & imaging results that were available during my care of the patient were reviewed by me and considered in my medical decision making (see chart for details).        53yo male with history of CHF, DM, hypertension, COPD presents with concern for 2 episodes of pre-syncope.  EKG with severe LVH but unchanged from prior.  No significant electrolyte abnormalities, no anemia. No hx to suggest dehydration or infection  as etiology. No neurologic symptoms, is not describe symptoms of vertigo, describing more lightheadedness and pre-syncopal episode and doubt CVA. No headache, doubt SAH.  Does report stress and working 2 jobs and consider vasovagal events, however given his cardiac history discussed I would like to observe him in the hospital.  He also did report some mild chest tightness prior to episode, and given his history have concern for possible arrhythmia.  High sensitivity troponin checked and elevated to 133 and 140.  He reports he has history of chronic troponin elevation although we do not have any records.  Troponin elevation nonspecific, possibly related to blood pressures in setting of CHF, but discussed again given elevation as well as near-syncopal episodes with chest tightness and hx of CHF that I would like to observe him in the hospital on the monitor, obtain ECHO, monitor troponins.  I also discussed with Cardiology and patient and told him that both the Cardiologist and myself recommend hospital admission for his symptoms.  He reports he understands our recommendations and the risks of leaving such as arrhythmia, heart attack and death but that he must go to work [employer].  I notified Cardiology that he refuses admission and they will follow up with him for outpatient appointment. Case management consulted to assist in helping patient find PCP as he is new to the area.  He is out of losartan, does not know dosing, but sent rx for 25mg  to his pharmacy.  Patient understands risks of leaving the ED against medical advice and reports he will return for any new or worsening symptoms.   Final Clinical Impressions(s) / ED Diagnoses   Final diagnoses:  Essential hypertension  Syncope, unspecified syncope type  Elevated troponin    ED Discharge Orders         Ordered    losartan (COZAAR) 25 MG tablet  Daily     09/30/19 2113           Alvira MondaySchlossman, Rahma Meller, MD 10/01/19 1217

## 2019-09-30 NOTE — ED Notes (Signed)
Per Dr. Billy Fischer, ok to food and drink...Marland Kitchen pt given crackers and ginger ale.

## 2019-10-19 ENCOUNTER — Ambulatory Visit: Payer: Self-pay | Admitting: Physician Assistant

## 2019-10-25 NOTE — Progress Notes (Signed)
Virtual Visit via Telephone Note   This visit type was conducted due to national recommendations for restrictions regarding the COVID-19 Pandemic (e.g. social distancing) in an effort to limit this patient's exposure and mitigate transmission in our community.  Due to his co-morbid illnesses, this patient is at least at moderate risk for complications without adequate follow up.  This format is felt to be most appropriate for this patient at this time.  The patient did not have access to video technology/had technical difficulties with video requiring transitioning to audio format only (telephone).  All issues noted in this document were discussed and addressed.  No physical exam could be performed with this format.  Please refer to the patient's chart for his  consent to telehealth for [facility].   Date:  10/26/2019   ID:  Johnny Schroeder, DOB 22-Jan-1966, MRN [id number]  Patient Location: Home Provider Location: Home  PCP:  Patient, No Pcp Per  Cardiologist:  Initially seen by Dr. Wyline Mood, lives in [facility] Electrophysiologist:  None   Evaluation Performed:  Follow-Up Visit  Chief Complaint: CP, syncope  History of Present Illness:    Johnny Schroeder is a 53 y.o. male with a PMH significant for HTN, DM2, and COPD. Chart carries a history of CHF, but no echo to view. He was seen by our service in consult for an ER visit for near syncope, dizziness, and chest pressure. He had hypertensive urgency on arrival with systolic BP in the 200s. EKG with SR and RBBB (old), chronic Q waves inferior and lateral leads and LVH with strain pattern. HS troponin 133 --> 140. He was noted to be a poor historian in the ER. He refused admission to the hospital, but did agree to an OP echocardiogram - this was not completed.   He presents today for follow up. He states that he feels well as long as he takes his metformin and blood pressure medications: HCTZ, losartan, and lopressor. He does not have a  way to check his BP at home. He is working a third shift job and a first shift job. He denies chest pain. States his CP resolved after he quit his first shift job. He denies further syncope. Overall the conversation is difficult as he was trying to board a bus to  at the time (reason he was unwilling to do a video visit).   The patient does not have symptoms concerning for COVID-19 infection (fever, chills, cough, or new shortness of breath).    Past Medical History:  Diagnosis Date  . CHF (congestive heart failure) (HCC)   . COPD (chronic obstructive pulmonary disease) (HCC)   . Diabetes mellitus without complication (HCC)   . Hypertension    No past surgical history on file.   Current Meds  Medication Sig  . albuterol (PROVENTIL HFA;VENTOLIN HFA) 108 (90 Base) MCG/ACT inhaler Inhale 1-2 puffs into the lungs every 6 (six) hours as needed for wheezing or shortness of breath.  . Ascorbic Acid (VITAMIN C) 1000 MG tablet Take 1,000 mg by mouth daily.  Marland Kitchen aspirin EC 81 MG tablet Take 81 mg by mouth daily.  Marland Kitchen emtricitabine-tenofovir AF ([name]) 200-25 MG tablet Take 1 tablet by mouth daily.  Marland Kitchen losartan (COZAAR) 25 MG tablet Take 1 tablet (25 mg total) by mouth daily.  . metoprolol tartrate (LOPRESSOR) 25 MG tablet Take 1 tablet by mouth 2 (two) times daily.     Allergies:   Lactose and Lisinopril   Social History  Tobacco Use  . Smoking status: Current Every Day Smoker    Packs/day: 1.00  . Smokeless tobacco: Never Used  Substance Use Topics  . Alcohol use: No  . Drug use: Yes    Types: Marijuana     Family Hx: The patient's family history is not on file.  ROS:   Please see the history of present illness.     All other systems reviewed and are negative.   Prior CV studies:   The following studies were reviewed today:  none  Labs/Other Tests and Data Reviewed:    EKG:  An ECG dated 09/30/19 was personally reviewed today and demonstrated:  sinus rhythm, HR  68, RBBB (old), Q waves inferior and lateral leads  Recent Labs: 09/30/2019: BUN 17; Creatinine, Ser 1.13; Hemoglobin 15.4; Platelets 271; Potassium 3.8; Sodium 138   Recent Lipid Panel No results found for: CHOL, TRIG, HDL, CHOLHDL, LDLCALC, LDLDIRECT  Wt Readings from Last 3 Encounters:  10/26/19 185 lb (83.9 kg)  03/03/19 185 lb (83.9 kg)  02/28/19 185 lb (83.9 kg)     Objective:    Vital Signs:  Wt 185 lb (83.9 kg)   BMI 25.09 kg/m    VITAL SIGNS:  reviewed RESPIRATORY:  normal respiratory effort, symmetric expansion NEURO:  alert and oriented x 3, no obvious focal deficit PSYCH:  normal affect  ASSESSMENT & PLAN:    Hypertension He was hypertensive in the ER with peak pressure 211/104. He does not have a BP cuff at home. We will try to get him a free one from the office. I described the proper way to take his BP. Continue ASA, HCTZ, losartan, and lopressor. Will need a nurse BP check when he comes for his echo.   Chest pain Syncope  He denies further episodes of chest pain, dizziness, and syncope. Suspect troponin elevation was due to demand ischemia given his hypertensive urgency. He agreed to proceed with echocardiogram. We will schedule that and then see him back in the office for an in-office visit for BP check.    COVID-19 Education: The signs and symptoms of COVID-19 were discussed with the patient and how to seek care for testing (follow up with PCP or arrange E-visit).  The importance of social distancing was discussed today.  Time:   Today, I have spent 14 minutes with the patient with telehealth technology discussing the above problems.     Medication Adjustments/Labs and Tests Ordered: Current medicines are reviewed at length with the patient today.  Concerns regarding medicines are outlined above.   Tests Ordered: No orders of the defined types were placed in this encounter.   Medication Changes: No orders of the defined types were placed in this  encounter.   Follow Up:  In Person in 3 week(s)  Signed, [name], [place]  10/26/2019 12:42 PM    [place]

## 2019-10-26 ENCOUNTER — Telehealth (INDEPENDENT_AMBULATORY_CARE_PROVIDER_SITE_OTHER): Payer: Self-pay | Admitting: Physician Assistant

## 2019-10-26 ENCOUNTER — Encounter: Payer: Self-pay | Admitting: Physician Assistant

## 2019-10-26 VITALS — Wt 185.0 lb

## 2019-10-26 DIAGNOSIS — R079 Chest pain, unspecified: Secondary | ICD-10-CM

## 2019-10-26 DIAGNOSIS — R55 Syncope and collapse: Secondary | ICD-10-CM

## 2019-10-26 DIAGNOSIS — I10 Essential (primary) hypertension: Secondary | ICD-10-CM | POA: Insufficient documentation

## 2019-10-26 NOTE — Addendum Note (Signed)
Addended by: Fidel Levy on: 10/26/2019 02:16 PM   Modules accepted: Orders

## 2019-10-26 NOTE — Patient Instructions (Signed)
Medication Instructions:  Your physician recommends that you continue on your current medications as directed. Please refer to the Current Medication list given to you today.  *If you need a refill on your cardiac medications before your next appointment, please call your pharmacy*  Lab Work: None needed  Testing/Procedures: Echo to be scheduled  Follow-Up: At [facility], you and your health needs are our priority.  As part of our continuing mission to provide you with exceptional heart care, we have created designated Provider Care Teams.  These Care Teams include your primary Cardiologist (physician) and Advanced Practice Providers (APPs -  Physician Assistants and Nurse Practitioners) who all work together to provide you with the care you need, when you need it.  Your next appointment:   Follow up after echocardiogram with APP in the office  ** we will contact you about getting a BP cuff

## 2019-10-28 MED FILL — DESCOVY 200-25 MG TABS: 200-25 | 30 days supply | Qty: 30 | Fill #1

## 2019-10-29 ENCOUNTER — Telehealth: Payer: Self-pay | Admitting: Physician Assistant

## 2019-10-29 NOTE — Telephone Encounter (Signed)
Attempted to call patient to let him know a BP cuff would be left for him to pick up at front desk at [place] office as of today, per staff message received from Bernardo Heater, supervisor.   Patient does not have VM set up.

## 2019-11-01 NOTE — Telephone Encounter (Signed)
Attempted to call patient about BP cuff. VM is not set up

## 2019-11-15 ENCOUNTER — Other Ambulatory Visit: Payer: Self-pay | Admitting: Pharmacist

## 2019-11-15 DIAGNOSIS — Z7252 High risk homosexual behavior: Secondary | ICD-10-CM

## 2019-11-24 ENCOUNTER — Other Ambulatory Visit (HOSPITAL_COMMUNITY): Payer: Self-pay

## 2019-11-24 ENCOUNTER — Ambulatory Visit: Payer: Self-pay

## 2019-11-24 MED FILL — DESCOVY 200-25 MG TABS: 200-25 | 30 days supply | Qty: 30 | Fill #2

## 2019-11-29 ENCOUNTER — Ambulatory Visit: Payer: Self-pay | Admitting: Physician Assistant

## 2019-12-07 ENCOUNTER — Other Ambulatory Visit (HOSPITAL_COMMUNITY): Payer: Self-pay

## 2019-12-10 ENCOUNTER — Encounter (HOSPITAL_COMMUNITY): Payer: Self-pay | Admitting: Emergency Medicine

## 2019-12-10 ENCOUNTER — Other Ambulatory Visit: Payer: Self-pay

## 2019-12-10 ENCOUNTER — Emergency Department (HOSPITAL_COMMUNITY): Payer: Self-pay

## 2019-12-10 ENCOUNTER — Emergency Department (HOSPITAL_COMMUNITY)
Admission: EM | Admit: 2019-12-10 | Discharge: 2019-12-10 | Disposition: A | Discharge status: Home / Self Care | Payer: Self-pay | Attending: Emergency Medicine | Admitting: Emergency Medicine

## 2019-12-10 DIAGNOSIS — I509 Heart failure, unspecified: Secondary | ICD-10-CM | POA: Insufficient documentation

## 2019-12-10 DIAGNOSIS — J449 Chronic obstructive pulmonary disease, unspecified: Secondary | ICD-10-CM | POA: Insufficient documentation

## 2019-12-10 DIAGNOSIS — Z79899 Other long term (current) drug therapy: Secondary | ICD-10-CM | POA: Insufficient documentation

## 2019-12-10 DIAGNOSIS — I10 Essential (primary) hypertension: Secondary | ICD-10-CM

## 2019-12-10 DIAGNOSIS — Z20828 Contact with and (suspected) exposure to other viral communicable diseases: Secondary | ICD-10-CM | POA: Insufficient documentation

## 2019-12-10 DIAGNOSIS — I110 Hypertensive heart disease with heart failure: Secondary | ICD-10-CM | POA: Insufficient documentation

## 2019-12-10 DIAGNOSIS — E119 Type 2 diabetes mellitus without complications: Secondary | ICD-10-CM | POA: Insufficient documentation

## 2019-12-10 DIAGNOSIS — F17200 Nicotine dependence, unspecified, uncomplicated: Secondary | ICD-10-CM | POA: Insufficient documentation

## 2019-12-10 DIAGNOSIS — Z7982 Long term (current) use of aspirin: Secondary | ICD-10-CM | POA: Insufficient documentation

## 2019-12-10 LAB — BASIC METABOLIC PANEL
Anion gap: 10 (ref 5–15)
BUN: 13 mg/dL (ref 6–20)
CO2: 24 mmol/L (ref 22–32)
Calcium: 9 mg/dL (ref 8.9–10.3)
Chloride: 107 mmol/L (ref 98–111)
Creatinine, Ser: 1.07 mg/dL (ref 0.61–1.24)
GFR calc Af Amer: >60 mL/min (ref >60)
GFR calc non Af Amer: >60 mL/min (ref >60)
Glucose, Bld: 197 mg/dL — ABNORMAL HIGH (ref 70–99)
Potassium: 4.2 mmol/L (ref 3.5–5.1)
Sodium: 141 mmol/L (ref 135–145)

## 2019-12-10 LAB — URINALYSIS, ROUTINE W REFLEX MICROSCOPIC
Bacteria, UA: NONE SEEN
Bilirubin Urine: NEGATIVE
Glucose, UA: NEGATIVE mg/dL
Ketones, ur: NEGATIVE mg/dL
Leukocytes,Ua: NEGATIVE
Nitrite: NEGATIVE
Protein, ur: 100 mg/dL — AB
Specific Gravity, Urine: 1.005 (ref 1.005–1.030)
pH: 7 (ref 5.0–8.0)

## 2019-12-10 LAB — CBC
HCT: 41.9 % (ref 39.0–52.0)
Hemoglobin: 14.2 g/dL (ref 13.0–17.0)
MCH: 33.3 pg (ref 26.0–34.0)
MCHC: 33.9 g/dL (ref 30.0–36.0)
MCV: 98.4 fL (ref 80.0–100.0)
Platelets: 235 10*3/uL (ref 150–400)
RBC: 4.26 MIL/uL (ref 4.22–5.81)
RDW: 13.4 % (ref 11.5–15.5)
WBC: 4.8 10*3/uL (ref 4.0–10.5)
nRBC: 0 % (ref 0.0–0.2)

## 2019-12-10 MED ORDER — HYDROCHLOROTHIAZIDE 25 MG PO TABS: 25.0000 mg | ORAL_TABLET | Freq: Every day | ORAL | 2 refills | Status: DC

## 2019-12-10 MED ORDER — SODIUM CHLORIDE 0.9% FLUSH: 3.0000 mL | Freq: Once | INTRAVENOUS | Status: DC

## 2019-12-10 MED ORDER — LOSARTAN POTASSIUM 25 MG PO TABS: 25.0000 mg | ORAL_TABLET | Freq: Every day | ORAL | 2 refills | Status: DC

## 2019-12-10 NOTE — ED Triage Notes (Signed)
Pt arrives to ED with c/c of HTN after being off on his medications for 1 week. Pt also reports both his hands and feet bilaterally feel cold.

## 2019-12-10 NOTE — ED Provider Notes (Signed)
[facility] EMERGENCY DEPARTMENT Provider Note   CSN: [id number] Arrival date & time: 12/10/19  1120     History Chief Complaint  Patient presents with  . Hypertension    Johnny Schroeder is a 53 y.o. male.  HPI   53 year old male multiple complaints.  Is concerned about his blood pressure.  He states he ran out of his blood pressure medication proxy 1 week ago.  This was completed to move due to the patient's vision is bilateral hands and feet.  Sensation will come and go.  Feels like his hands are cold at times.  No appreciable exacerbating leaving factors.  Some mild "chest congestion."  Does not feel short of breath.  Occasionally coughing.  No fevers or chills.  Is concerned about possible COVID.   Past Medical History:  Diagnosis Date  . CHF (congestive heart failure) (HCC)   . COPD (chronic obstructive pulmonary disease) (HCC)   . Diabetes mellitus without complication (HCC)   . Hypertension     Patient Active Problem List   Diagnosis Date Noted  . Essential hypertension 10/26/2019    History reviewed. No pertinent surgical history.     No family history on file.  Social History   Tobacco Use  . Smoking status: Current Every Day Smoker    Packs/day: 1.00  . Smokeless tobacco: Never Used  Substance Use Topics  . Alcohol use: No  . Drug use: Yes    Types: Marijuana    Home Medications Prior to Admission medications   Medication Sig Start Date End Date Taking? Authorizing Provider  albuterol (PROVENTIL HFA;VENTOLIN HFA) 108 (90 Base) MCG/ACT inhaler Inhale 1-2 puffs into the lungs every 6 (six) hours as needed for wheezing or shortness of breath. 03/03/19   [name], MD  Ascorbic Acid (VITAMIN C) 1000 MG tablet Take 1,000 mg by mouth daily.    [provider]  aspirin EC 81 MG tablet Take 81 mg by mouth daily.    [provider]  emtricitabine-tenofovir AF ([name]) 200-25 MG tablet Take 1 tablet by mouth daily.  09/29/19   [name], [name], RPH-CPP  hydrochlorothiazide (HYDRODIURIL) 25 MG tablet Take 1 tablet by mouth daily. 03/30/18 03/30/19  [provider]  losartan (COZAAR) 25 MG tablet Take 1 tablet (25 mg total) by mouth daily. 09/30/19   Alvira Monday, MD  metoprolol tartrate (LOPRESSOR) 25 MG tablet Take 1 tablet by mouth 2 (two) times daily.    [provider]    Allergies    Lactose and Lisinopril  Review of Systems   Review of Systems   All systems reviewed and negative, other than as noted in HPI.  Physical Exam Updated Vital Signs BP (!) 188/117   Pulse 76   Temp 98.3 F (36.8 C) (Oral)   Resp 16   SpO2 98%   Physical Exam Vitals and nursing note reviewed.  Constitutional:      General: He is not in acute distress.    Appearance: He is well-developed.  HENT:     Head: Normocephalic and atraumatic.  Eyes:     General:        Right eye: No discharge.        Left eye: No discharge.     Conjunctiva/sclera: Conjunctivae normal.  Cardiovascular:     Rate and Rhythm: Normal rate and regular rhythm.     Heart sounds: Normal heart sounds. No murmur. No friction rub. No gallop.   Pulmonary:  Effort: Pulmonary effort is normal. No respiratory distress.     Breath sounds: Normal breath sounds.  Abdominal:     General: There is no distension.     Palpations: Abdomen is soft.     Tenderness: There is no abdominal tenderness.  Musculoskeletal:        General: No tenderness.     Cervical back: Neck supple.  Skin:    General: Skin is warm and dry.  Neurological:     Mental Status: He is alert.  Psychiatric:        Behavior: Behavior normal.        Thought Content: Thought content normal.     ED Results / Procedures / Treatments   Labs (all labs ordered are listed, but only abnormal results are displayed) Labs Reviewed  BASIC METABOLIC PANEL - Abnormal; Notable for the following components:      Result Value   Glucose, Bld 197 (*)    All  other components within normal limits  URINALYSIS, ROUTINE W REFLEX MICROSCOPIC - Abnormal; Notable for the following components:   Color, Urine STRAW (*)    Hgb urine dipstick SMALL (*)    Protein, ur 100 (*)    All other components within normal limits  NOVEL CORONAVIRUS, NAA (HOSP ORDER, SEND-OUT TO REF LAB; TAT 18-24 HRS)  CBC    EKG None  Radiology No results found.   DG Chest Portable 1 View  Result Date: 12/10/2019 CLINICAL DATA:  Dyspnea EXAM: PORTABLE CHEST 1 VIEW COMPARISON:  03/03/2019 FINDINGS: Cardiomegaly. Both lungs are clear. The visualized skeletal structures are unremarkable. IMPRESSION: Cardiomegaly without acute abnormality of the lungs. Electronically Signed   By: Lauralyn Primes M.D.   On: 12/10/2019 15:53   ECHOCARDIOGRAM COMPLETE  Result Date: 12/13/2019   ECHOCARDIOGRAM REPORT   Patient Name:   Johnny Schroeder Date of Exam: 12/13/2019 Medical Rec #:  [id number]       Height:       72.0 in Accession #:    [id number]      Weight:       185.0 lb Date of Birth:  1966-12-26       BSA:          2.06 m Patient Age:    53 years        BP:           201/104 mmHg Patient Gender: M               HR:           51 bpm. Exam Location:  [address] Procedure: 2D Echo, Cardiac Doppler and Color Doppler Indications:    R55 Syncope; R07.9* Chest pain, unspecified; I10 Hypertension  History:        Patient has no prior history of Echocardiogram examinations.                 CHF, COPD, Arrythmias:RBBB; Risk Factors:Diabetes.  Sonographer:    Cathie Beams RCS Referring Phys: [id number] ANGELA NICOLE DUKE IMPRESSIONS  1. Left ventricular ejection fraction, by visual estimation, is >75%. The left ventricle has hyperdynamic function. There is severely increased left ventricular hypertrophy.  2. Left ventricular diastolic parameters are consistent with Grade I diastolic dysfunction (impaired relaxation).  3. The left ventricle has no regional wall motion abnormalities.  4. Global right  ventricle has normal systolic function.The right ventricular size is normal. No increase in right ventricular wall thickness.  5. Left atrial size was normal.  6.  Right atrial size was normal.  7. The mitral valve is normal in structure. Trivial mitral valve regurgitation.  8. The tricuspid valve is normal in structure. Tricuspid valve regurgitation is not demonstrated.  9. The aortic valve is normal in structure. Aortic valve regurgitation is not visualized. 10. The pulmonic valve was normal in structure. Pulmonic valve regurgitation is not visualized. 11. The atrial septum is grossly normal. FINDINGS  Left Ventricle: Left ventricular ejection fraction, by visual estimation, is >75%. The left ventricle has hyperdynamic function. The left ventricle has no regional wall motion abnormalities. There is severely increased left ventricular hypertrophy. Concentric left ventricular hypertrophy. Left ventricular diastolic parameters are consistent with Grade I diastolic dysfunction (impaired relaxation). Right Ventricle: The right ventricular size is normal. No increase in right ventricular wall thickness. Global RV systolic function is has normal systolic function. Left Atrium: Left atrial size was normal in size. Right Atrium: Right atrial size was normal in size Pericardium: There is no evidence of pericardial effusion. Mitral Valve: The mitral valve is normal in structure. Trivial mitral valve regurgitation. Tricuspid Valve: The tricuspid valve is normal in structure. Tricuspid valve regurgitation is not demonstrated. Aortic Valve: The aortic valve is normal in structure. Aortic valve regurgitation is not visualized. Pulmonic Valve: The pulmonic valve was normal in structure. Pulmonic valve regurgitation is not visualized. Pulmonic regurgitation is not visualized. Aorta: The aortic root and ascending aorta are structurally normal, with no evidence of dilitation. IAS/Shunts: The atrial septum is grossly normal.  LEFT  VENTRICLE PLAX 2D LVIDd:         3.10 cm  Diastology LVIDs:         1.60 cm  LV e' lateral:   3.85 cm/s LV PW:         2.50 cm  LV E/e' lateral: 22.4 LV IVS:        2.70 cm  LV e' medial:    3.92 cm/s LVOT diam:     2.00 cm  LV E/e' medial:  22.0 LV SV:         31 ml LV SV Index:   14.84 LVOT Area:     3.14 cm  RIGHT VENTRICLE RV Basal diam:  2.30 cm RV S prime:     10.80 cm/s TAPSE (M-mode): 1.7 cm LEFT ATRIUM             Index       RIGHT ATRIUM          Index LA diam:        3.70 cm 1.80 cm/m  RA Area:     8.53 cm LA Vol (A2C):   68.8 ml 33.38 ml/m RA Volume:   14.40 ml 6.99 ml/m LA Vol (A4C):   56.3 ml 27.32 ml/m LA Biplane Vol: 62.7 ml 30.42 ml/m  AORTIC VALVE LVOT Vmax:   110.00 cm/s LVOT Vmean:  74.500 cm/s LVOT VTI:    0.238 m  AORTA Ao Root diam: 3.30 cm MITRAL VALVE MV Area (PHT): 4.10 cm              SHUNTS MV PHT:        53.65 msec            Systemic VTI:  0.24 m MV Decel Time: 185 msec              Systemic Diam: 2.00 cm MV E velocity: 86.40 cm/s  103 cm/s MV A velocity: 108.00 cm/s 70.3 cm/s MV E/A ratio:  0.80  1.5  Mertie Moores MD Electronically signed by Mertie Moores MD Signature Date/Time: 12/13/2019/5:40:34 PM    Final    Procedures Procedures (including critical care time)  Medications Ordered in ED Medications  sodium chloride flush (NS) 0.9 % injection 3 mL (has no administration in time range)    ED Course  I have reviewed the triage vital signs and the nursing notes.  Pertinent labs & imaging results that were available during my care of the patient were reviewed by me and considered in my medical decision making (see chart for details).    MDM Rules/Calculators/A&P  53 year old male with multiple complaints.  I suspect is primarily about possible Covid.  Clinically I doubt, but testing was obtained.  He generally appears well.  Chest x-ray without acute abnormality.  He is extremely hypertensive.  He was provided with prescriptions as he previously took.   Advised to follow-up with his PCP and keep a log of his blood pressure.  Needs to quarantine till Covid test is resulted.  Johnny Schroeder was evaluated in Emergency Department on 12/10/2019 for the symptoms described in the history of present illness. He was evaluated in the context of the global COVID-19 pandemic, which necessitated consideration that the patient might be at risk for infection with the SARS-CoV-2 virus that causes COVID-19. Institutional protocols and algorithms that pertain to the evaluation of patients at risk for COVID-19 are in a state of rapid change based on information released by regulatory bodies including the CDC and federal and state organizations. These policies and algorithms were followed during the patient's care in the ED.  Final Clinical Impression(s) / ED Diagnoses Final diagnoses:  None    Rx / DC Orders ED Discharge Orders    None       Virgel Manifold, MD 12/14/19 [phone]

## 2019-12-11 LAB — NOVEL CORONAVIRUS, NAA (HOSP ORDER, SEND-OUT TO REF LAB; TAT 18-24 HRS): SARS-CoV-2, NAA: NOT DETECTED

## 2019-12-13 ENCOUNTER — Ambulatory Visit (HOSPITAL_COMMUNITY): Payer: Self-pay | Attending: Cardiovascular Disease

## 2019-12-13 ENCOUNTER — Other Ambulatory Visit: Payer: Self-pay

## 2019-12-13 ENCOUNTER — Encounter (INDEPENDENT_AMBULATORY_CARE_PROVIDER_SITE_OTHER): Payer: Self-pay

## 2019-12-13 DIAGNOSIS — R55 Syncope and collapse: Secondary | ICD-10-CM | POA: Insufficient documentation

## 2019-12-13 DIAGNOSIS — I10 Essential (primary) hypertension: Secondary | ICD-10-CM | POA: Insufficient documentation

## 2019-12-13 DIAGNOSIS — R079 Chest pain, unspecified: Secondary | ICD-10-CM | POA: Insufficient documentation

## 2019-12-16 ENCOUNTER — Ambulatory Visit: Payer: Self-pay | Admitting: Physician Assistant

## 2019-12-17 ENCOUNTER — Other Ambulatory Visit: Payer: Self-pay | Admitting: Pharmacist

## 2019-12-17 DIAGNOSIS — Z7252 High risk homosexual behavior: Secondary | ICD-10-CM

## 2019-12-21 NOTE — Progress Notes (Signed)
Cardiology Clinic Note   Patient Name: Johnny Schroeder Date of Encounter: 12/22/2019  Primary Care Provider:  Patient, No Pcp Per Primary Cardiologist:  Armanda Magicraci Turner, MD  Patient Profile    Johnny Schroeder 53 year old male presents today for follow-up of his essential hypertension.  Past Medical History    Past Medical History:  Diagnosis Date  . CHF (congestive heart failure) (HCC)   . COPD (chronic obstructive pulmonary disease) (HCC)   . Diabetes mellitus without complication (HCC)   . Hypertension    No past surgical history on file.  Allergies  Allergies  Allergen Reactions  . Lactose Other (See Comments)    GI upset  . Lisinopril Diarrhea and Other (See Comments)    Joint pain    History of Present Illness    Johnny Schroeder has a past medical history of hypertension, diabetes mellitus type 2, CHF, and COPD.  He was recently seen in the emergency department for evaluation of his near syncope, dizziness, and chest pressure.  He was noted to be having hypertensive urgency with a systolic blood pressure in the 200s.  His EKG at that time showed sinus rhythm with right bundle branch block (old), chronic Q-wave inferior and lateral leads and LVH.  His at bedtime troponins ranged from 133-140.  He was a poor historian in the emergency department.  He refused admission to the hospital however, he did agree to outpatient echocardiogram which was not been completed.  He was last seen by Micah FlesherAngela Duke PA-C via virtual platform on 10/26/2019.  During that time he stated he felt well as long as he was taking his Metformin and blood pressure medications (HCTZ, losartan, and Lopressor).  He did not have a home blood pressure cuff and was not monitoring his blood pressure at that time.  He was working a third shift job as well as a first shift job.  He did deny chest pain and stated that the chest pain resolved after he left his first shift job.  He was not having further syncope.  He  presents the clinic today and states he feels well.  He is under some increased stress due to recent death in his family.  He has not taken his HCTZ or losartan in the past 2 weeks.  He has taken his metoprolol 25.  States he does not follow a low-sodium diet.  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  He routinely walks at least 4 hours of the shift working in security detail.  He is a pack per day smoker.  I will change his HCTZ to chlorthalidone and change his losartan to olmesartan.  He will follow up with the hypertension clinic in 1 week and I will see him back in a month.  I have discussed this case with DOD and clinic pharmacy.  He denies chest pain, shortness of breath, lower extremity edema, fatigue, palpitations, melena, hematuria, hemoptysis, diaphoresis, weakness, presyncope, syncope, orthopnea, and PND.  He has neuro intact at this time.   Home Medications    Prior to Admission medications   Medication Sig Start Date End Date Taking? Authorizing Provider  albuterol (PROVENTIL HFA;VENTOLIN HFA) 108 (90 Base) MCG/ACT inhaler Inhale 1-2 puffs into the lungs every 6 (six) hours as needed for wheezing or shortness of breath. 03/03/19   Arby BarrettePfeiffer, Marcy, MD  Ascorbic Acid (VITAMIN C) 1000 MG tablet Take 1,000 mg by mouth daily.    [provider]  aspirin  EC 81 MG tablet Take 81 mg by mouth daily.    [provider]  emtricitabine-tenofovir AF ([name]) 200-25 MG tablet Take 1 tablet by mouth daily. 09/29/19   [name], [name], RPH-CPP  hydrochlorothiazide (HYDRODIURIL) 25 MG tablet Take 1 tablet (25 mg total) by mouth daily. 12/10/19 12/09/20  Virgel Manifold, MD  losartan (COZAAR) 25 MG tablet Take 1 tablet (25 mg total) by mouth daily. 12/10/19   Virgel Manifold, MD  metoprolol tartrate (LOPRESSOR) 25 MG tablet Take 1 tablet by mouth 2 (two) times daily.    [provider]    Family History    No family history on  file. has no family status information on file.   Social History    Social History   Socioeconomic History  . Marital status: Divorced    Spouse name: Not on file  . Number of children: Not on file  . Years of education: Not on file  . Highest education level: Not on file  Occupational History  . Not on file  Tobacco Use  . Smoking status: Current Every Day Smoker    Packs/day: 1.00  . Smokeless tobacco: Never Used  Substance and Sexual Activity  . Alcohol use: No  . Drug use: Yes    Types: Marijuana  . Sexual activity: Not on file  Other Topics Concern  . Not on file  Social History Narrative  . Not on file   Social Determinants of Health   Financial Resource Strain:   . Difficulty of Paying Living Expenses: Not on file  Food Insecurity:   . Worried About Charity fundraiser in the Last Year: Not on file  . Ran Out of Food in the Last Year: Not on file  Transportation Needs:   . Lack of Transportation (Medical): Not on file  . Lack of Transportation (Non-Medical): Not on file  Physical Activity:   . Days of Exercise per Week: Not on file  . Minutes of Exercise per Session: Not on file  Stress:   . Feeling of Stress : Not on file  Social Connections:   . Frequency of Communication with Friends and Family: Not on file  . Frequency of Social Gatherings with Friends and Family: Not on file  . Attends Religious Services: Not on file  . Active Member of Clubs or Organizations: Not on file  . Attends Archivist Meetings: Not on file  . Marital Status: Not on file  Intimate Partner Violence:   . Fear of Current or Ex-Partner: Not on file  . Emotionally Abused: Not on file  . Physically Abused: Not on file  . Sexually Abused: Not on file     Review of Systems    General:  No chills, fever, night sweats or weight changes.  Cardiovascular:  No chest pain, dyspnea on exertion, edema, orthopnea, palpitations, paroxysmal nocturnal dyspnea. Dermatological: No  rash, lesions/masses Respiratory: No cough, dyspnea Urologic: No hematuria, dysuria Abdominal:   No nausea, vomiting, diarrhea, bright red blood per rectum, melena, or hematemesis Neurologic:  No visual changes, wkns, changes in mental status. All other systems reviewed and are otherwise negative except as noted above.  Physical Exam    VS:  BP (!) 200/94   Pulse 68   Ht 6\' 1"  (1.854 m)   Wt 180 lb 9.6 oz (81.9 kg)   SpO2 98%   BMI 23.83 kg/m  , BMI Body mass index is 23.83 kg/m. GEN: Well nourished, well developed, in no  acute distress. HEENT: normal. Neck: Supple, no JVD, carotid bruits, or masses. Cardiac: RRR, no murmurs, rubs, or gallops. No clubbing, cyanosis, edema.  Radials/DP/PT 2+ and equal bilaterally.  Respiratory:  Respirations regular and unlabored, clear to auscultation bilaterally. GI: Soft, nontender, nondistended, BS + x 4. MS: no deformity or atrophy. Skin: warm and dry, no rash. Neuro:  Strength and sensation are intact. Psych: Normal affect.  Accessory Clinical Findings    ECG personally reviewed by me today-none today   EKG 09/30/2019 Sinus rhythm heart rate 68 right bundle branch block Q waves inferior and lateral leads  Echocardiogram-12/13/2019 IMPRESSIONS    1. Left ventricular ejection fraction, by visual estimation, is >75%. The left ventricle has hyperdynamic function. There is severely increased left ventricular hypertrophy.  2. Left ventricular diastolic parameters are consistent with Grade I diastolic dysfunction (impaired relaxation).  3. The left ventricle has no regional wall motion abnormalities.  4. Global right ventricle has normal systolic function.The right ventricular size is normal. No increase in right ventricular wall thickness.  5. Left atrial size was normal.  6. Right atrial size was normal.  7. The mitral valve is normal in structure. Trivial mitral valve regurgitation.  8. The tricuspid valve is normal in structure.  Tricuspid valve regurgitation is not demonstrated.  9. The aortic valve is normal in structure. Aortic valve regurgitation is not visualized. 10. The pulmonic valve was normal in structure. Pulmonic valve regurgitation is not visualized. 11. The atrial septum is grossly normal.   Assessment & Plan   1.  Essential hypertension-BP today 200/94.  Unable to check his blood pressure at home Continue Lopressor 25 mg twice daily Stop losartan 25 mg daily Stop hydrochlorothiazide 25 mg daily Start chlorthalidone 25 mg daily  Start olmesartan 40 mg daily Continue 81 mg aspirin daily Heart healthy low-sodium diet-salty 6 given Increase physical activity as tolerated-goal 150 minutes of moderate aerobic physical activity weekly Discussed/educated about concerns with stroke and MI related to blood pressure in the 200s over 100s.  He expresses understanding and states he will go to pharmacy and pick up medications since he can.  Chest pain-no chest pain today.  Has not had any other episodes of chest pain since he was in the emergency department.  Elevated troponins in the emergency department were suspected to be related to demand ischemia in the presence of his hypertensive urgency.  Syncope-no further episodes/events of presyncope or syncope since being in the emergency department.  Disposition: Follow-up with hypertension clinic in 1 week and with me in 1 month.   Thomasene Ripple. Renan Danese NP-C       [facility] [address] Office [phone] Fax [phone]

## 2019-12-22 ENCOUNTER — Ambulatory Visit (INDEPENDENT_AMBULATORY_CARE_PROVIDER_SITE_OTHER): Payer: Self-pay | Admitting: General Practice

## 2019-12-22 ENCOUNTER — Other Ambulatory Visit: Payer: Self-pay

## 2019-12-22 ENCOUNTER — Ambulatory Visit: Payer: Self-pay | Admitting: Pharmacist

## 2019-12-22 ENCOUNTER — Encounter (INDEPENDENT_AMBULATORY_CARE_PROVIDER_SITE_OTHER): Payer: Self-pay

## 2019-12-22 ENCOUNTER — Encounter: Payer: Self-pay | Admitting: General Practice

## 2019-12-22 VITALS — BP 200/94 | HR 68 | Ht 73.0 in | Wt 180.6 lb

## 2019-12-22 DIAGNOSIS — I10 Essential (primary) hypertension: Secondary | ICD-10-CM

## 2019-12-22 DIAGNOSIS — R55 Syncope and collapse: Secondary | ICD-10-CM

## 2019-12-22 DIAGNOSIS — R079 Chest pain, unspecified: Secondary | ICD-10-CM

## 2019-12-22 DIAGNOSIS — Z7252 High risk homosexual behavior: Secondary | ICD-10-CM

## 2019-12-22 MED ORDER — CHLORTHALIDONE 25 MG PO TABS: 25.0000 mg | ORAL_TABLET | Freq: Every day | ORAL | 3 refills | Status: DC

## 2019-12-22 MED ORDER — OLMESARTAN MEDOXOMIL 40 MG PO TABS: 40.0000 mg | ORAL_TABLET | Freq: Every day | ORAL | 3 refills | Status: DC

## 2019-12-22 NOTE — Patient Instructions (Signed)
Medication Instructions:  STOP LOSARTAN STOP HYDROCHLOROTHIAZIDE  START CHLORTHALIDONE 25MG  DAILY  START OLMESARTAN 40MG  DAILY  If you need a refill on your cardiac medications before your next appointment, please call your pharmacy.  Special Instructions: PLEASE TRY TO QUIT SMOKING-SEE ATTACHED  PLEASE READ AND FOLLOW SALTY 6 ATTACHED  Follow-Up: IN 1 MONTH In Person Coletta Memos, FNP.    At [facility], you and your health needs are our priority.  As part of our continuing mission to provide you with exceptional heart care, we have created designated Provider Care Teams.  These Care Teams include your primary Cardiologist (physician) and Advanced Practice Providers (APPs -  Physician Assistants and Nurse Practitioners) who all work together to provide you with the care you need, when you need it.  Thank you for choosing [facility] at [facility]!!     Happy Holidays!!  Steps to Quit Smoking Smoking tobacco is the leading cause of preventable death. It can affect almost every organ in the body. Smoking puts you and people around you at risk for many serious, long-lasting (chronic) diseases. Quitting smoking can be hard, but it is one of the best things that you can do for your health. It is never too late to quit. How do I get ready to quit? When you decide to quit smoking, make a plan to help you succeed. Before you quit:  Pick a date to quit. Set a date within the next 2 weeks to give you time to prepare.  Write down the reasons why you are quitting. Keep this list in places where you will see it often.  Tell your family, friends, and co-workers that you are quitting. Their support is important.  Talk with your doctor about the choices that may help you quit.  Find out if your health insurance will pay for these treatments.  Know the people, places, things, and activities that make you want to smoke (triggers). Avoid them. What first steps can I take to quit  smoking?  Throw away all cigarettes at home, at work, and in your car.  Throw away the things that you use when you smoke, such as ashtrays and lighters.  Clean your car. Make sure to empty the ashtray.  Clean your home, including curtains and carpets. What can I do to help me quit smoking? Talk with your doctor about taking medicines and seeing a counselor at the same time. You are more likely to succeed when you do both.  If you are pregnant or breastfeeding, talk with your doctor about counseling or other ways to quit smoking. Do not take medicine to help you quit smoking unless your doctor tells you to do so. To quit smoking: Quit right away  Quit smoking totally, instead of slowly cutting back on how much you smoke over a period of time.  Go to counseling. You are more likely to quit if you go to counseling sessions regularly. Take medicine You may take medicines to help you quit. Some medicines need a prescription, and some you can buy over-the-counter. Some medicines may contain a drug called nicotine to replace the nicotine in cigarettes. Medicines may:  Help you to stop having the desire to smoke (cravings).  Help to stop the problems that come when you stop smoking (withdrawal symptoms). Your doctor may ask you to use:  Nicotine patches, gum, or lozenges.  Nicotine inhalers or sprays.  Non-nicotine medicine that is taken by mouth. Find resources Find resources and other ways to help  you quit smoking and remain smoke-free after you quit. These resources are most helpful when you use them often. They include:  Online chats with a Veterinary surgeon.  Phone quitlines.  Printed Materials engineer.  Support groups or group counseling.  Text messaging programs.  Mobile phone apps. Use apps on your mobile phone or tablet that can help you stick to your quit plan. There are many free apps for mobile phones and tablets as well as websites. Examples include Quit Guide from the  [employer] and smokefree.gov  What things can I do to make it easier to quit?   Talk to your family and friends. Ask them to support and encourage you.  Call a phone quitline (1-800-QUIT-NOW), reach out to support groups, or work with a Veterinary surgeon.  Ask people who smoke to not smoke around you.  Avoid places that make you want to smoke, such as: ? Bars. ? Parties. ? Smoke-break areas at work.  Spend time with people who do not smoke.  Lower the stress in your life. Stress can make you want to smoke. Try these things to help your stress: ? Getting regular exercise. ? Doing deep-breathing exercises. ? Doing yoga. ? Meditating. ? Doing a body scan. To do this, close your eyes, focus on one area of your body at a time from head to toe. Notice which parts of your body are tense. Try to relax the muscles in those areas. How will I feel when I quit smoking? Day 1 to 3 weeks Within the first 24 hours, you may start to have some problems that come from quitting tobacco. These problems are very bad 2-3 days after you quit, but they do not often last for more than 2-3 weeks. You may get these symptoms:  Mood swings.  Feeling restless, nervous, angry, or annoyed.  Trouble concentrating.  Dizziness.  Strong desire for high-sugar foods and nicotine.  Weight gain.  Trouble pooping (constipation).  Feeling like you may vomit (nausea).  Coughing or a sore throat.  Changes in how the medicines that you take for other issues work in your body.  Depression.  Trouble sleeping (insomnia). Week 3 and afterward After the first 2-3 weeks of quitting, you may start to notice more positive results, such as:  Better sense of smell and taste.  Less coughing and sore throat.  Slower heart rate.  Lower blood pressure.  Clearer skin.  Better breathing.  Fewer sick days. Quitting smoking can be hard. Do not give up if you fail the first time. Some people need to try a few times before they  succeed. Do your best to stick to your quit plan, and talk with your doctor if you have any questions or concerns. Summary  Smoking tobacco is the leading cause of preventable death. Quitting smoking can be hard, but it is one of the best things that you can do for your health.  When you decide to quit smoking, make a plan to help you succeed.  Quit smoking right away, not slowly over a period of time.  When you start quitting, seek help from your doctor, family, or friends. This information is not intended to replace advice given to you by your health care provider. Make sure you discuss any questions you have with your health care provider. Document Released: 10/12/2009 Document Revised: 03/05/2019 Document Reviewed: 03/06/2019 Elsevier Patient Education  2020 [employer].

## 2019-12-23 LAB — HIV ANTIBODY (ROUTINE TESTING W REFLEX): HIV 1&2 Ab, 4th Generation: NONREACTIVE

## 2019-12-23 LAB — RPR: RPR Ser Ql: NONREACTIVE

## 2019-12-27 ENCOUNTER — Other Ambulatory Visit: Payer: Self-pay | Admitting: Pharmacist

## 2019-12-27 DIAGNOSIS — Z7252 High risk homosexual behavior: Secondary | ICD-10-CM

## 2019-12-27 MED ORDER — DESCOVY 200-25 MG PO TABS: 1.0000 | ORAL_TABLET | Freq: Every day | ORAL | 2 refills | Status: DC

## 2019-12-27 MED FILL — DESCOVY 200-25 MG TABS: 200-25 | 30 days supply | Qty: 30 | Fill #0

## 2019-12-27 NOTE — Progress Notes (Signed)
Patient's HIV antibody is negative.  Will send in 3 more months of [name] to [facility].  

## 2020-01-04 ENCOUNTER — Ambulatory Visit: Payer: Self-pay

## 2020-01-04 NOTE — Progress Notes (Deleted)
     01/04/2020 Johnny Schroeder 12-03-1966 [id number]   HPI:  Johnny Schroeder is a 54 y.o. male patient of Dr Mayford Knife, with a PMH below who presents today for hypertension clinic evaluation.  He was seen by Edd Fabian NP just 2-3 weeks ago following an ED visit for hypertension.  When he saw Verdon Cummins his pressure was 200/94.  Patient admitted to having skipped his losartan and hctz for at least 2 weeks.  He was seen in the ED twice in the final 3 months of 2020, both times for hypertension.  In both of those instances he was noted to have run out of medications.    Past Medical History: CHF EF > 75% by Echo 11/2019, LV hypertropohy, Grade 1 diastolic dysfunction  DM2 No A1c listed, pt currently on no DM medications (metformin??)  COPD   Tobacco abuse 1 ppd? Interest in cutting back?        Blood Pressure Goal:  130/80  Current Medications: olmesartan 40 mg qd, chlorthalidone 25 mg qd  Family Hx:  Social Hx:  Diet:  Exercise:  Home BP readings: no home meter - want one? Will use?  Intolerances:   Labs:  Wt Readings from Last 3 Encounters:  12/22/19 180 lb 9.6 oz (81.9 kg)  10/26/19 185 lb (83.9 kg)  03/03/19 185 lb (83.9 kg)   BP Readings from Last 3 Encounters:  12/22/19 (!) 200/94  12/10/19 (!) 188/117  09/30/19 (!) 176/94   Pulse Readings from Last 3 Encounters:  12/22/19 68  12/10/19 76  09/30/19 67    Current Outpatient Medications  Medication Sig Dispense Refill  . albuterol (PROVENTIL HFA;VENTOLIN HFA) 108 (90 Base) MCG/ACT inhaler Inhale 1-2 puffs into the lungs every 6 (six) hours as needed for wheezing or shortness of breath. 1 Inhaler 0  . Ascorbic Acid (VITAMIN C) 1000 MG tablet Take 1,000 mg by mouth daily.    Marland Kitchen aspirin EC 81 MG tablet Take 81 mg by mouth daily.    . chlorthalidone (HYGROTON) 25 MG tablet Take 1 tablet (25 mg total) by mouth daily. 30 tablet 3  . emtricitabine-tenofovir AF ([name]) 200-25 MG tablet Take 1 tablet by mouth daily. 30  tablet 2  . metoprolol tartrate (LOPRESSOR) 25 MG tablet Take 1 tablet by mouth 2 (two) times daily.    Marland Kitchen olmesartan (BENICAR) 40 MG tablet Take 1 tablet (40 mg total) by mouth daily. 30 tablet 3   No current facility-administered medications for this visit.    Allergies  Allergen Reactions  . Lactose Other (See Comments)    GI upset  . Lisinopril Diarrhea and Other (See Comments)    Joint pain    Past Medical History:  Diagnosis Date  . CHF (congestive heart failure) (HCC)   . COPD (chronic obstructive pulmonary disease) (HCC)   . Diabetes mellitus without complication (HCC)   . Hypertension     There were no vitals taken for this visit.  No problem-specific Assessment & Plan notes found for this encounter.  Avoid ACEI, amlodipine, nitrates   Phillips Hay PharmD CPP [facility] [address] [place], [place] [address] [phone]

## 2020-01-18 NOTE — Progress Notes (Deleted)
Cardiology Clinic Note   Patient Name: Johnny Schroeder Date of Encounter: 01/18/2020  Primary Care Provider:  Patient, No Pcp Per Primary Cardiologist:  Armanda Magic, MD  Patient Profile    Johnny Schroeder 54 year old male presents today for follow-up of his essential hypertension.  Past Medical History    Past Medical History:  Diagnosis Date  . CHF (congestive heart failure) (HCC)   . COPD (chronic obstructive pulmonary disease) (HCC)   . Diabetes mellitus without complication (HCC)   . Hypertension    No past surgical history on file.  Allergies  Allergies  Allergen Reactions  . Lactose Other (See Comments)    GI upset  . Lisinopril Diarrhea and Other (See Comments)    Joint pain    History of Present Illness   [name] has a past medical history of hypertension, diabetes mellitus type 2, CHF, and COPD.  He was recently seen in the emergency department for evaluation of his near syncope, dizziness, and chest pressure.  He was noted to be having hypertensive urgency with a systolic blood pressure in the 200s.  His EKG at that time showed sinus rhythm with right bundle branch block (old), chronic Q-wave inferior and lateral leads and LVH.  His at bedtime troponins ranged from 133-140.  He was a poor historian in the emergency department.  He refused admission to the hospital however, he did agree to outpatient echocardiogram which was not been completed.  He was last seen by [name] PA-C via virtual platform on 10/26/2019.  During that time he stated he felt well as long as he was taking his Metformin and blood pressure medications (HCTZ, losartan, and Lopressor).  He did not have a home blood pressure cuff and was not monitoring his blood pressure at that time.  He was working a third shift job as well as a first shift job.  He did deny chest pain and stated that the chest pain resolved after he left his first shift job.  He was not having further syncope.  He  presents the clinic today and states he feels well.  He is under some increased stress due to recent death in his family.  He has not taken his HCTZ or losartan in the past 2 weeks.  He has taken his metoprolol 25.  States he does not follow a low-sodium diet.  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  He routinely walks at least 4 hours of the shift working in security detail.  He is a pack per day smoker.  I will change his HCTZ to chlorthalidone and change his losartan to olmesartan.  He will follow up with the hypertension clinic in 1 week and I will see him back in a month.  I have discussed this case with DOD and clinic pharmacy.  He denies chest pain, shortness of breath, lower extremity edema, fatigue, palpitations, melena, hematuria, hemoptysis, diaphoresis, weakness, presyncope, syncope, orthopnea, and PND.  He has neuro intact at this time.   Home Medications    Prior to Admission medications   Medication Sig Start Date End Date Taking? Authorizing Provider  albuterol (PROVENTIL HFA;VENTOLIN HFA) 108 (90 Base) MCG/ACT inhaler Inhale 1-2 puffs into the lungs every 6 (six) hours as needed for wheezing or shortness of breath. 03/03/19   [name], MD  Ascorbic Acid (VITAMIN C) 1000 MG tablet Take 1,000 mg by mouth daily.    [provider]  aspirin EC  81 MG tablet Take 81 mg by mouth daily.    [provider]  chlorthalidone (HYGROTON) 25 MG tablet Take 1 tablet (25 mg total) by mouth daily. 12/22/19 03/21/20  [name], PA  emtricitabine-tenofovir AF ([name]) 200-25 MG tablet Take 1 tablet by mouth daily. 12/27/19   [name], [name], RPH-CPP  metoprolol tartrate (LOPRESSOR) 25 MG tablet Take 1 tablet by mouth 2 (two) times daily.    [provider]  olmesartan (BENICAR) 40 MG tablet Take 1 tablet (40 mg total) by mouth daily. 12/22/19   [name], PA    Family History    No family history on  file. has no family status information on file.   Social History    Social History   Socioeconomic History  . Marital status: Divorced    Spouse name: Not on file  . Number of children: Not on file  . Years of education: Not on file  . Highest education level: Not on file  Occupational History  . Not on file  Tobacco Use  . Smoking status: Current Every Day Smoker    Packs/day: 1.00  . Smokeless tobacco: Never Used  Substance and Sexual Activity  . Alcohol use: No  . Drug use: Yes    Types: Marijuana  . Sexual activity: Not on file  Other Topics Concern  . Not on file  Social History Narrative  . Not on file   Social Determinants of Health   Financial Resource Strain:   . Difficulty of Paying Living Expenses: Not on file  Food Insecurity:   . Worried About Charity fundraiser in the Last Year: Not on file  . Ran Out of Food in the Last Year: Not on file  Transportation Needs:   . Lack of Transportation (Medical): Not on file  . Lack of Transportation (Non-Medical): Not on file  Physical Activity:   . Days of Exercise per Week: Not on file  . Minutes of Exercise per Session: Not on file  Stress:   . Feeling of Stress : Not on file  Social Connections:   . Frequency of Communication with Friends and Family: Not on file  . Frequency of Social Gatherings with Friends and Family: Not on file  . Attends Religious Services: Not on file  . Active Member of Clubs or Organizations: Not on file  . Attends Archivist Meetings: Not on file  . Marital Status: Not on file  Intimate Partner Violence:   . Fear of Current or Ex-Partner: Not on file  . Emotionally Abused: Not on file  . Physically Abused: Not on file  . Sexually Abused: Not on file     Review of Systems    General:  No chills, fever, night sweats or weight changes.  Cardiovascular:  No chest pain, dyspnea on exertion, edema, orthopnea, palpitations, paroxysmal nocturnal dyspnea. Dermatological: No  rash, lesions/masses Respiratory: No cough, dyspnea Urologic: No hematuria, dysuria Abdominal:   No nausea, vomiting, diarrhea, bright red blood per rectum, melena, or hematemesis Neurologic:  No visual changes, wkns, changes in mental status. All other systems reviewed and are otherwise negative except as noted above.  Physical Exam    VS:  There were no vitals taken for this visit. , BMI There is no height or weight on file to calculate BMI. GEN: Well nourished, well developed, in no acute distress. HEENT: normal. Neck: Supple, no JVD, carotid bruits, or masses. Cardiac: RRR, no murmurs, rubs, or gallops.  No clubbing, cyanosis, edema.  Radials/DP/PT 2+ and equal bilaterally.  Respiratory:  Respirations regular and unlabored, clear to auscultation bilaterally. GI: Soft, nontender, nondistended, BS + x 4. MS: no deformity or atrophy. Skin: warm and dry, no rash. Neuro:  Strength and sensation are intact. Psych: Normal affect.  Accessory Clinical Findings    ECG personally reviewed by me today- *** - No acute changes  EKG 09/30/2019 Sinus rhythm heart rate 68 right bundle branch block Q waves inferior and lateral leads  Echocardiogram-12/13/2019 IMPRESSIONS   1. Left ventricular ejection fraction, by visual estimation, is >75%. The left ventricle has hyperdynamic function. There is severely increased left ventricular hypertrophy. 2. Left ventricular diastolic parameters are consistent with Grade I diastolic dysfunction (impaired relaxation). 3. The left ventricle has no regional wall motion abnormalities. 4. Global right ventricle has normal systolic function.The right ventricular size is normal. No increase in right ventricular wall thickness. 5. Left atrial size was normal. 6. Right atrial size was normal. 7. The mitral valve is normal in structure. Trivial mitral valve regurgitation. 8. The tricuspid valve is normal in structure. Tricuspid valve regurgitation is not  demonstrated. 9. The aortic valve is normal in structure. Aortic valve regurgitation is not visualized. 10. The pulmonic valve was normal in structure. Pulmonic valve regurgitation is not visualized. 11. The atrial septum is grossly normal.  Assessment & Plan   1.  Essential hypertension-BP today ***.  Unable to check his blood pressure at home Continue Lopressor 25 mg twice daily Continue chlorthalidone 25 mg daily  Continue olmesartan 40 mg daily Continue 81 mg aspirin daily Heart healthy low-sodium diet-salty 6 given Increase physical activity as tolerated-goal 150 minutes of moderate aerobic physical activity weekly Reviewed  concerns with stroke and MI related to blood pressure in the 200s over 100s.  He expresses understanding.  Chest pain-*** chest pain today.  Has not had any other episodes of chest pain since he was in the emergency department.  Elevated troponins in the emergency department were suspected to be related to demand ischemia in the presence of his hypertensive urgency.  Syncope-no further episodes/events of presyncope or syncope since being in the emergency department.  Disposition: Follow-up with hypertension clinic in***    [place]. Josely Moffat NP-C       [facility] [address] Office [phone] Fax [phone]

## 2020-01-19 ENCOUNTER — Ambulatory Visit: Payer: Self-pay | Admitting: General Practice

## 2020-01-27 ENCOUNTER — Telehealth: Payer: Self-pay | Admitting: Pharmacy Technician

## 2020-01-27 DIAGNOSIS — I160 Hypertensive urgency: Secondary | ICD-10-CM | POA: Diagnosis present

## 2020-01-27 NOTE — Telephone Encounter (Signed)
[facility] Patient Advocate Encounter   Received notification from [name] that prior authorization for [name] is required.   PA submitted on 01/27/2020 Key [id number] Status is pending [id number]    [facility] Clinic will continue to follow.  Beulah Gandy, CPhT Specialty Pharmacy Patient [facility] for Infectious Disease Phone: [phone] Fax: [phone] 01/27/2020 11:00 AM

## 2020-02-11 NOTE — Telephone Encounter (Signed)
[facility] Patient Advocate Encounter  Received notification from [name] that the request for prior authorization for [name] has been denied due to non-formulary. They only cover [name] for treatment. Generic [name] is listed as preferred.     With his new [facility] status, he will probably have to begin filling medication at [facility]. Unable to reach patient to inform them of this change. Multiple voicemails have been left by clinic and pharmacy.   Beulah Gandy, CPhT Specialty Pharmacy Patient [facility] for Infectious Disease Phone: [phone] Fax: [phone] 02/11/2020 3:43 PM

## 2020-02-14 DIAGNOSIS — I422 Other hypertrophic cardiomyopathy: Secondary | ICD-10-CM

## 2020-02-17 DIAGNOSIS — Z95810 Presence of automatic (implantable) cardiac defibrillator: Secondary | ICD-10-CM | POA: Diagnosis present

## 2020-03-03 ENCOUNTER — Other Ambulatory Visit: Payer: Self-pay | Admitting: Pharmacist

## 2020-03-03 DIAGNOSIS — Z7252 High risk homosexual behavior: Secondary | ICD-10-CM

## 2020-03-03 MED ORDER — EMTRICITABINE-TENOFOVIR DF 200-300 MG PO TABS: 1.0000 | ORAL_TABLET | Freq: Every day | ORAL | 0 refills | Status: DC

## 2020-03-13 ENCOUNTER — Encounter: Payer: Self-pay | Admitting: General Practice

## 2020-03-20 ENCOUNTER — Ambulatory Visit: Payer: Self-pay | Admitting: Pharmacist

## 2020-03-20 ENCOUNTER — Telehealth: Payer: Self-pay | Admitting: Pharmacy Technician

## 2020-03-20 MED FILL — EMTRICITABINE-TENOFOVIR DF: 200-300 | 30 days supply | Qty: 30 | Fill #0

## 2020-03-20 NOTE — Telephone Encounter (Signed)
[facility] Patient Advocate Encounter   Received notification from [name] that prior authorization for [name] generic is required. They originally required prior authorization on [name] and then listed [name] as the now preferred.    PA submitted on 03/20/2020 Key [id number] Status is pending [id number]    [facility] will continue to follow.  Beulah Gandy, CPhT Specialty Pharmacy Patient [facility] for Infectious Disease Phone: [phone] Fax: [phone] 03/20/2020 2:23 PM

## 2020-03-27 ENCOUNTER — Ambulatory Visit: Payer: Self-pay | Attending: Internal Medicine

## 2020-03-27 ENCOUNTER — Ambulatory Visit: Payer: Self-pay

## 2020-03-27 DIAGNOSIS — Z23 Encounter for immunization: Secondary | ICD-10-CM

## 2020-03-27 NOTE — Progress Notes (Signed)
   Covid-19 Vaccination Clinic  Name:  Johnny Schroeder    MRN: [id number] DOB: [birth year]  03/27/2020  Johnny Schroeder was observed post Covid-19 immunization for 15 minutes without incident. He was provided with Vaccine Information Sheet and instruction to access the V-Safe system.   Johnny Schroeder was instructed to call 911 with any severe reactions post vaccine: Marland Kitchen Difficulty breathing  . Swelling of face and throat  . A fast heartbeat  . A bad rash all over body  . Dizziness and weakness   Immunizations Administered    Name Date Dose VIS Date Route   Pfizer COVID-19 Vaccine 03/27/2020  2:45 PM 0.3 mL 12/10/2019 Intramuscular   Manufacturer: [employer], [employer]   Lot: [id number]   NDC: [address]-1

## 2020-04-07 ENCOUNTER — Telehealth: Payer: Self-pay | Admitting: General Practice

## 2020-04-07 DIAGNOSIS — Z95810 Presence of automatic (implantable) cardiac defibrillator: Secondary | ICD-10-CM

## 2020-04-07 NOTE — Telephone Encounter (Signed)
New Message   Patient is calling because he just recently had a defib placed in January at [facility] He is asking that he be referred to one of our EP providers so that he does not have to continue to go to [facility] for his device checks. Please call to discuss.

## 2020-04-07 NOTE — Telephone Encounter (Signed)
REFERRAL PLACED

## 2020-04-19 ENCOUNTER — Ambulatory Visit: Payer: Self-pay | Attending: Internal Medicine

## 2020-04-19 DIAGNOSIS — Z23 Encounter for immunization: Secondary | ICD-10-CM

## 2020-04-19 NOTE — Progress Notes (Signed)
   Covid-19 Vaccination Clinic  Name:  Johnny Schroeder    MRN: [id number] DOB: [birth year]  04/19/2020  Mr. Hoe was observed post Covid-19 immunization for 15 minutes without incident. He was provided with Vaccine Information Sheet and instruction to access the V-Safe system.   Mr. Palka was instructed to call 911 with any severe reactions post vaccine: Marland Kitchen Difficulty breathing  . Swelling of face and throat  . A fast heartbeat  . A bad rash all over body  . Dizziness and weakness   Immunizations Administered    Name Date Dose VIS Date Route   Pfizer COVID-19 Vaccine 04/19/2020 11:31 AM 0.3 mL 02/23/2019 Intramuscular   Manufacturer: [employer], [employer]   Lot: [id number]   NDC: [address]-9

## 2020-04-24 ENCOUNTER — Other Ambulatory Visit: Payer: Self-pay | Admitting: Pharmacist

## 2020-04-24 DIAGNOSIS — Z7252 High risk homosexual behavior: Secondary | ICD-10-CM

## 2020-04-26 NOTE — Telephone Encounter (Signed)
[facility] Patient Advocate Encounter  Prior Authorization for [name] (generic) has been approved.     Patients co-pay is $0.   Karie Mainland with [facility] called [facility] to let them know and spoke with Clearance Coots who let us know.  [facility] will continue to follow.  Netty Starring. Dimas Aguas CPhT Specialty Pharmacy Patient [facility] for Infectious Disease Phone: [phone] Fax:  [phone]

## 2020-04-27 ENCOUNTER — Other Ambulatory Visit: Payer: Self-pay

## 2020-04-27 ENCOUNTER — Encounter: Payer: Self-pay | Admitting: Cardiology

## 2020-04-27 DIAGNOSIS — Z7252 High risk homosexual behavior: Secondary | ICD-10-CM

## 2020-04-27 NOTE — Progress Notes (Deleted)
Electrophysiology Office Note   Date:  04/27/2020   ID:  Johnny Schroeder, DOB [birth year], MRN [id number]  PCP:  Patient, No Pcp Per  Cardiologist:  Mayford Knife Primary Electrophysiologist:  Mariel Lukins Jorja Loa, MD    Chief Complaint: ICD   History of Present Illness: Johnny Schroeder is a 54 y.o. male who is being seen today for the evaluation of ICD at the request of Ronney Asters, NP. Presenting today for electrophysiology evaluation.  He has a history significant for hypertension, type 2 diabetes, diastolic heart failure, tobacco use and COPD.  He had a recent emergency room visit for near syncope, dizziness, and chest pressure.  He was found to have a systolic blood pressure in the 200s.  ECG showed sinus rhythm with right bundle branch block.  He presented to [facility] 01/26/2020 hypertensive urgency.  He had a chest pain at the time.  His blood pressure was 219/107.  He was started on carvedilol, Imdur, amlodipine, and losartan.  His HCTZ was continued.  There was a concern for hypertrophic cardiomyopathy and thus he underwent ICD implant 01/31/2020.  He underwent left heart catheterization which showed no obstructive coronary artery disease.  Today, he denies*** symptoms of palpitations, chest pain, shortness of breath, orthopnea, PND, lower extremity edema, claudication, dizziness, presyncope, syncope, bleeding, or neurologic sequela. The patient is tolerating medications without difficulties.    Past Medical History:  Diagnosis Date  . CHF (congestive heart failure) (HCC)   . COPD (chronic obstructive pulmonary disease) (HCC)   . Diabetes mellitus without complication (HCC)   . Hypertension    No past surgical history on file.   Current Outpatient Medications  Medication Sig Dispense Refill  . albuterol (PROVENTIL HFA;VENTOLIN HFA) 108 (90 Base) MCG/ACT inhaler Inhale 1-2 puffs into the lungs every 6 (six) hours as needed for wheezing or shortness of breath. 1 Inhaler 0    . Ascorbic Acid (VITAMIN C) 1000 MG tablet Take 1,000 mg by mouth daily.    Marland Kitchen aspirin EC 81 MG tablet Take 81 mg by mouth daily.    . chlorthalidone (HYGROTON) 25 MG tablet Take 1 tablet (25 mg total) by mouth daily. 30 tablet 3  . emtricitabine-tenofovir ([name]) 200-300 MG tablet Take 1 tablet by mouth daily. 30 tablet 0  . metoprolol tartrate (LOPRESSOR) 25 MG tablet Take 1 tablet by mouth 2 (two) times daily.    Marland Kitchen olmesartan (BENICAR) 40 MG tablet Take 1 tablet (40 mg total) by mouth daily. 30 tablet 3   No current facility-administered medications for this visit.    Allergies:   Lactose and Lisinopril   Social History:  The patient  reports that he has been smoking. He has been smoking about 1.00 pack per day. He has never used smokeless tobacco. He reports current drug use. Drug: Marijuana. He reports that he does not drink alcohol.   Family History:  The patient's ***family history is not on file.    ROS:  Please see the history of present illness.   Otherwise, review of systems is positive for none.   All other systems are reviewed and negative.    PHYSICAL EXAM: VS:  There were no vitals taken for this visit. , BMI There is no height or weight on file to calculate BMI. GEN: Well nourished, well developed, in no acute distress  HEENT: normal  Neck: no JVD, carotid bruits, or masses Cardiac: ***RRR; no murmurs, rubs, or gallops,no edema  Respiratory:  clear to auscultation bilaterally, normal work  of breathing GI: soft, nontender, nondistended, + BS MS: no deformity or atrophy  Skin: warm and dry, device pocket is well healed Neuro:  Strength and sensation are intact Psych: euthymic mood, full affect  EKG:  EKG {ACTION; IS/IS NLG:[id number]} ordered today. Personal review of the ekg ordered *** shows ***  Device interrogation is reviewed today in detail.  See PaceArt for details.   Recent Labs: 12/10/2019: BUN 13; Creatinine, Ser 1.07; Hemoglobin 14.2; Platelets 235;  Potassium 4.2; Sodium 141    Lipid Panel  No results found for: CHOL, TRIG, HDL, CHOLHDL, VLDL, LDLCALC, LDLDIRECT   Wt Readings from Last 3 Encounters:  12/22/19 180 lb 9.6 oz (81.9 kg)  10/26/19 185 lb (83.9 kg)  03/03/19 185 lb (83.9 kg)      Other studies Reviewed: Additional studies/ records that were reviewed today include: TTE 12/13/19  Review of the above records today demonstrates:  1. Left ventricular ejection fraction, by visual estimation, is >75%. The  left ventricle has hyperdynamic function. There is severely increased left  ventricular hypertrophy.  2. Left ventricular diastolic parameters are consistent with Grade I  diastolic dysfunction (impaired relaxation).  3. The left ventricle has no regional wall motion abnormalities.  4. Global right ventricle has normal systolic function.The right  ventricular size is normal. No increase in right ventricular wall  thickness.  5. Left atrial size was normal.  6. Right atrial size was normal.  7. The mitral valve is normal in structure. Trivial mitral valve  regurgitation.  8. The tricuspid valve is normal in structure. Tricuspid valve  regurgitation is not demonstrated.  9. The aortic valve is normal in structure. Aortic valve regurgitation is  not visualized.  10. The pulmonic valve was normal in structure. Pulmonic valve  regurgitation is not visualized.  11. The atrial septum is grossly normal.   Left heart cath 01/28/2020 Nonobstructive CAD-right dominant system Normal RHC-PA pressures Normal LVEDP Normal bilateral renal arteries  Cardiac MRI 1/28/20213 1. The left ventricular cavity size is lower limits of normal to small in cavity size. There is severe concentric LVH (max wall thickness = 3.4 cm at the mid inferoseptum). There is cavity obliteration during systole. The LVEF is calculated at 71%.  2. The right ventricle is lower limits of normal to small in size. There is RV apical hypertrophy.   Regional and global RV systolic function are normal.  3. The left atrium is moderately enlarged. The right atrium is mildly enlarged.  4. The aortic valve is trileaflet in morphology. There is no significant aortic valve stenosis or regurgitation.   5. There is chordal SAM (systolic anterior motion of the mitral valve). There is flow turbulence in the LVOT indicative of a dynamic LVOT gradient. The peak velocity is at least 2.4 m/sec (peak gradient 24 mm Hg at rest). There is associated mild mitral valve regurgitation.  6. Delayed enhancement imaging for viability is abnormal.    a. There is patchy, mid-myocardial hyperenhancement involving the mid inferoseptum. This pattern of hyperenhancement is observed in a patients with hypertrophic cardiomypathy.   b. There is subendocardial hyperenhancement involving the mid-distal anterior wall, the distal septum, distal inferior wall, distal lateral wall, and apex, consistent with subendocardial myocardial infarction in the distribution of the LAD.  7. Adenosine stress perfusion imaging is abnormal. There are perfusion defects involving the basal-mid anterior wall, distal septum, and distal inferior wall, most consistent with inducible myocardial ischemia and a small amount of infarction.  8. There is no  evidence of an intracardiac thrombus.  9. There is a small pericardial effusion adjacent to the inferior wall of the LV and the distal RV free wall. The pericardium is normal in thickness  ASSESSMENT AND PLAN:  1.  Hypertension: ***  2.  Hypertrophic cardiomyopathy: Status post ***dual-chamber ICD.  Device functioning appropriately.  No changes. 2.  Current medicines are reviewed at length with the patient today.   The patient {ACTIONS; HAS/DOES NOT HAVE:19233} concerns regarding his medicines.  The following changes were made today:  {NONE DEFAULTED:18576::"none"}  Labs/ tests ordered today include: *** No orders of the defined  types were placed in this encounter.    Disposition:   FU with Azeez Dunker {gen number [id number]} {Days to years:10300}  Signed, Hamlin Devine Jorja Loa, MD  04/27/2020 10:25 AM     [facility] [address] [place] [address] [phone] (office) [phone] (fax)

## 2020-04-28 ENCOUNTER — Other Ambulatory Visit: Payer: Self-pay | Admitting: Pharmacist

## 2020-04-28 DIAGNOSIS — Z7252 High risk homosexual behavior: Secondary | ICD-10-CM

## 2020-04-28 LAB — HIV ANTIBODY (ROUTINE TESTING W REFLEX): HIV 1&2 Ab, 4th Generation: NONREACTIVE

## 2020-04-28 MED ORDER — EMTRICITABINE-TENOFOVIR DF 200-300 MG PO TABS: 1.0000 | ORAL_TABLET | Freq: Every day | ORAL | 2 refills | Status: DC

## 2020-04-28 NOTE — Progress Notes (Signed)
Patient's HIV antibody is negative.  Will send in 3 more months of [name] to [facility].  

## 2020-05-03 MED FILL — EMTRICITABINE-TENOFOVIR DF: 200-300 | 30 days supply | Qty: 30 | Fill #0

## 2020-05-07 NOTE — Progress Notes (Addendum)
Cardiology Office Note Date:  05/08/2020  Patient ID:  Johnny Schroeder, DOB 04-01-1966, MRN [id number] PCP:  Patient, No Pcp Per  Cardiologist:  Malvin Johns Dr. Wyline Mood Oct 2020 hospital consultation  >> Dr. Jettie Pagan EP: Dr. Graciela Husbands (new today)    Chief Complaint: establish EP clinic  History of Present Illness: Johnny Schroeder is a 54 y.o. male with history of COPD, HTN , DM2, RBBB  He has had issus with poor BP control, it seems associated with medication noncompliance, (difficult work schedules, personal stressors).  He saw J. Cleaver, NP Dec 2020 had medication adjustment with plans to f/u with HTN clinic.    He was following with Dr. Regino Schultze at [facility], he last saw him 02/14/20, note reports  I reviewed his transthoracic echocardiogram and cardiac MRI. Although he has had longstanding refractory hypertension, the degree of his left ventricular hypertrophy as well as mild asymmetry suggest possible hypertrophic cardiomyopathy in addition to hypertensive heart disease.  He does not have significant outflow tract obstruction but rather appears to have cavity obliteration. No indication for septal reduction therapy at this time.  He now has primary prevention ICD implanted and will have follow-up by EP I have advised EKG and echocardiographic screening of his sister. He has no children.  2) hypertensive heart disease Blood pressures well controlled on current medical regimen. Although he is on several medications that would not typically be used in the setting of hypertrophic cardiomyopathy including amlodipine and diuretic therapy, he has had very refractory and elevated high blood pressure so will not change any medications at this time. I have advised obtaining a blood pressure monitor for checking his blood pressure at home.  He had hospitalization at [facility] in January 2021 for hypertensive urgency blood pressure 219/107 mmHg associated with chest pain and abnormal troponin delta  level. Transthoracic echocardiogram showed similar findings with severe concentric left ventricular hypertrophy without significant outflow tract gradient or mitral regurgitation. He underwent cardiac MRI 01/27/20 showing 1. The left ventricular cavity size is lower limits of normal to small in cavity size. There is severe concentric LVH (max wall thickness = 3.4 cm at the mid inferoseptum). There is cavity obliteration during systole. The LVEF is calculated at 71%.  2. The right ventricle is lower limits of normal to small in size. There is RV apical hypertrophy.  Regional and global RV systolic function are normal.  3. The left atrium is moderately enlarged. The right atrium is mildly enlarged.  4. The aortic valve is trileaflet in morphology. There is no significant aortic valve stenosis or regurgitation.   5. There is chordal SAM (systolic anterior motion of the mitral valve). There is flow turbulence in the LVOT indicative of a dynamic LVOT gradient. The peak velocity is at least 2.4 m/sec (peak gradient 24 mm Hg at rest). There is associated mild mitral valve regurgitation.  6. Delayed enhancement imaging for viability is abnormal.  a. There is patchy, mid-myocardial hyperenhancement involving the mid inferoseptum. This pattern of hyperenhancement is observed in a patients with hypertrophic cardiomypathy. b. There is subendocardial hyperenhancement involving the mid-distal anterior wall, the distal septum, distal inferior wall, distal lateral wall, and apex, consistent with subendocardial myocardial infarction in the distribution of the LAD.  7. Adenosine stress perfusion imaging is abnormal. There are perfusion defects involving the basal-mid anterior wall, distal septum, and distal inferior wall, most consistent with inducible myocardial ischemia and a small amount of infarction. LGE 5.4%  8. There is no evidence  of an intracardiac thrombus.  9. There is a small  pericardial effusion adjacent to the inferior wall of the LV and the distal RV free wall. The pericardium is normal in thickness.  Cardiac cath on 01/28/20 showed LHC/RHC 01/28/20: Coronary arteries Dominance: right All coronaries normal (no CAD).  State: Baseline RA: 5 mmHg (mean) RV: 35/ 4 mmHg PA: 36/ 13 22 mmHg (mean) PCW: 11 mmHg (mean) AV O2: 5.5 vol% Cardiac output: 4.7 L/min Cardiac index: 2.3 L/min-m2 PVR: 2.3 Wood units  He had runs of NSVT on telemetry. EP was consulted and primary prevention ICD was implanted prior to discharge for possible HCM with extreme hypertrophy, presyncope and NSVT. Several adjustments to his antihypertensive medications were made including addition of carvedilol, amlodipine and increasing dosage of losartan.  He comes in today to be seen to establish with EP/device clinic follow up, this is much more convenient for him. He has not until now had his device checked since the hospital/implant  He has felt well.  Infrequently feels a little flutter in his heart beat, no CP, no SOB, no near syncope or syncope. He denies dizzy spells.  No shocks He starts his new job tomorrow and says his financial situation will be much better and he will be better able to get his meds, he is currently withouht his amlodipine and coreg. He denies any exertional incapacities.  He sleeps well, snores, but denies symptoms of orthopnea or PND.   Device information BSci dual chamber ICD implanted 01/31/20, ([facility]), primary prevention   Past Medical History:  Diagnosis Date  . CHF (congestive heart failure) ([place])   . COPD (chronic obstructive pulmonary disease) ([place])   . Diabetes mellitus without complication ([place])   . Hypertension     No past surgical history on file.  Current Outpatient Medications  Medication Sig Dispense Refill  . amLODipine (NORVASC) 10 MG tablet Take 10 mg by mouth daily.    Marland Kitchen aspirin EC 81 MG tablet Take 81 mg by mouth daily.    Marland Kitchen  atorvastatin (LIPITOR) 40 MG tablet Take 40 mg by mouth daily.    . carvedilol (COREG) 12.5 MG tablet Take 37.5 mg by mouth 2 (two) times daily.    Marland Kitchen glimepiride (AMARYL) 2 MG tablet Take 2 mg by mouth daily.    . hydrochlorothiazide (HYDRODIURIL) 25 MG tablet Take 25 mg by mouth daily.    . isosorbide mononitrate (IMDUR) 60 MG 24 hr tablet Take 60 mg by mouth daily.    Marland Kitchen losartan (COZAAR) 50 MG tablet Take 50 mg by mouth daily.     No current facility-administered medications for this visit.    Allergies:   Lactose and Lisinopril   Social History:  The patient  reports that he has been smoking. He has been smoking about 1.00 pack per day. He has never used smokeless tobacco. He reports current drug use. Drug: Marijuana. He reports that he does not drink alcohol.   Family History:  . Diabetes type II Mother  . Glaucoma Mother  . High blood pressure (Hypertension) Mother  . Stroke Maternal Grandmother    ROS:  Please see the history of present illness.  All other systems are reviewed and otherwise negative.   PHYSICAL EXAM:  VS:  BP (!) 156/64   Pulse 96   Ht 6\' 1"  (1.854 m)   Wt 174 lb (78.9 kg)   SpO2 97%   BMI 22.96 kg/m  BMI: Body mass index is 22.96 kg/m. Well nourished,  well developed, in no acute distress  HEENT: normocephalic, atraumatic  Neck: no JVD, carotid bruits or masses Cardiac:  RRR; 2-3/6SM, no rubs, or gallops Lungs:  CTA b/l, no wheezing, rhonchi or rales  Abd: soft, nontender MS: no deformity or atrophy Ext: no edema  Skin: warm and dry, no rash Neuro:  No gross deficits appreciated Psych: euthymic mood, full affect  ICD site: he still had the steri strips in place, they were removed without difficulty, the site is well healed, is stable, no tethering or discomfort   EKG:  Not done today  ICD interrogation done today and reviewed by myself:  Battery and lead measurements are good Auto and manual threshold testing give similar results He has had  some NSVT, no treated epsiodes AP 6%, VP <1%  Recent Labs: 12/10/2019: BUN 13; Creatinine, Ser 1.07; Hemoglobin 14.2; Platelets 235; Potassium 4.2; Sodium 141  No results found for requested labs within last 8760 hours.   CrCl cannot be calculated (Patient's most recent lab result is older than the maximum 21 days allowed.).   Wt Readings from Last 3 Encounters:  05/08/20 174 lb (78.9 kg)  12/22/19 180 lb 9.6 oz (81.9 kg)  10/26/19 185 lb (83.9 kg)     Other studies reviewed: Additional studies/records reviewed today include: summarized above  ASSESSMENT AND PLAN:  1. ICD     Intact function  2. HCM 3. Chronic CHF (diastolic)     No symptoms or exam findings to suggest volume OL       4. HTN     Not terrible, he is without 2 meds    Dr. Graciela Husbands has seen the patient today to establish care as well Discussed shock plan Discussed his snoring and recommends sleep study Discussed importance of his medicines, [facility] $3 and $10 dollar options for his medicines, discussed Good Rx as well We have provided the [facility] Wellness clinic information to establish primary care and help with medicines if they can as well.  We reviewed follow up here, he has his Latitude transmitter at home and has been added to our remote scheduled/device clinic  Encouraged to keep Dr. Regino Schultze  Disposition: F/u with remotes Q 3 months, I would like to see him back on his medicines, back to clinic in a month, sooner if needed.  Current medicines are reviewed at length with the patient today.  The patient did not have any concerns regarding medicines.  Norma Fredrickson, PA-C 05/08/2020 6:39 PM     [facility] [address] [place] [address] [phone] (office)  [phone] (fax)  Seen and examined Plan reviewed and importance of medications stressed Device/shock plan reviewed Will need sleep study

## 2020-05-08 ENCOUNTER — Other Ambulatory Visit: Payer: Self-pay

## 2020-05-08 ENCOUNTER — Ambulatory Visit (INDEPENDENT_AMBULATORY_CARE_PROVIDER_SITE_OTHER): Payer: 59 | Admitting: Physician Assistant

## 2020-05-08 ENCOUNTER — Telehealth: Payer: Self-pay | Admitting: *Deleted

## 2020-05-08 VITALS — BP 156/64 | HR 96 | Ht 73.0 in | Wt 174.0 lb

## 2020-05-08 DIAGNOSIS — I5032 Chronic diastolic (congestive) heart failure: Secondary | ICD-10-CM | POA: Diagnosis not present

## 2020-05-08 DIAGNOSIS — I422 Other hypertrophic cardiomyopathy: Secondary | ICD-10-CM | POA: Diagnosis not present

## 2020-05-08 DIAGNOSIS — R0683 Snoring: Secondary | ICD-10-CM

## 2020-05-08 DIAGNOSIS — I10 Essential (primary) hypertension: Secondary | ICD-10-CM

## 2020-05-08 DIAGNOSIS — I4729 Other ventricular tachycardia: Secondary | ICD-10-CM

## 2020-05-08 DIAGNOSIS — Z95810 Presence of automatic (implantable) cardiac defibrillator: Secondary | ICD-10-CM

## 2020-05-08 DIAGNOSIS — I472 Ventricular tachycardia: Secondary | ICD-10-CM

## 2020-05-08 NOTE — Telephone Encounter (Signed)
-----   Message from Oleta Mouse, [place] sent at 05/08/2020  4:42 PM EDT ----- Regarding: PT NEEDS SLEEP STUDY

## 2020-05-08 NOTE — Patient Instructions (Signed)
Medication Instructions:  Your physician recommends that you continue on your current medications as directed. Please refer to the Current Medication list given to you today.  *If you need a refill on your cardiac medications before your next appointment, please call your pharmacy*   Lab Work: NONE ORDERED  TODAY   If you have labs (blood work) drawn today and your tests are completely normal, you will receive your results only by: . MyChart Message (if you have MyChart) OR . A paper copy in the mail If you have any lab test that is abnormal or we need to change your treatment, we will call you to review the results.   Testing/Procedures: NONE ORDERED  TODAY   Follow-Up: At [facility], you and your health needs are our priority.  As part of our continuing mission to provide you with exceptional heart care, we have created designated Provider Care Teams.  These Care Teams include your primary Cardiologist (physician) and Advanced Practice Providers (APPs -  Physician Assistants and Nurse Practitioners) who all work together to provide you with the care you need, when you need it.  We recommend signing up for the patient portal called "MyChart".  Sign up information is provided on this After Visit Summary.  MyChart is used to connect with patients for Virtual Visits (Telemedicine).  Patients are able to view lab/test results, encounter notes, upcoming appointments, etc.  Non-urgent messages can be sent to your provider as well.   To learn more about what you can do with MyChart, go to [email or web address].    Your next appointment:   1 month(s)   The format for your next appointment:   In Person  Provider:   You may see Dr. Klein or one of the following Advanced Practice Providers on your designated Care Team:    Amber Seiler, NP  Renee Ursuy, PA-C  Michael "Andy" Tillery, PA-C    Other Instructions   

## 2020-05-09 NOTE — Addendum Note (Signed)
Addended by: Duke Salvia on: 05/09/2020 06:16 PM   Modules accepted: Level of Service

## 2020-06-02 MED FILL — EMTRICITABINE-TENOFOVIR DF: 200-300 | 30 days supply | Qty: 30 | Fill #1

## 2020-06-07 NOTE — Progress Notes (Signed)
Cardiology Office Note Date:  06/08/2020  Patient ID:  Johnny Schroeder, DOB 06/23/1966, MRN [id number] PCP:  Patient, No Pcp Per  Cardiologist:  Clarnce Flock Dr. Harl Bowie Oct 2020 hospital consultation  >> Dr. Bonne Dolores EP: Dr. Caryl Comes (new today)    Chief Complaint:  planned f/u  History of Present Illness: Johnny Schroeder is a 54 y.o. male with history of COPD, HTN , DM2, RBBB  He has had issus with poor BP control, it seems associated with medication noncompliance, (difficult work schedules, personal stressors).  He saw J. Cleaver, NP Dec 2020 had medication adjustment with plans to f/u with HTN clinic.    He was following with Dr. Mina Marble at [facility], he last saw him 02/14/20, note reports  I reviewed his transthoracic echocardiogram and cardiac MRI. Although he has had longstanding refractory hypertension, the degree of his left ventricular hypertrophy as well as mild asymmetry suggest possible hypertrophic cardiomyopathy in addition to hypertensive heart disease.  He does not have significant outflow tract obstruction but rather appears to have cavity obliteration. No indication for septal reduction therapy at this time.  He now has primary prevention ICD implanted and will have follow-up by EP I have advised EKG and echocardiographic screening of his sister. He has no children.  2) hypertensive heart disease Blood pressures well controlled on current medical regimen. Although he is on several medications that would not typically be used in the setting of hypertrophic cardiomyopathy including amlodipine and diuretic therapy, he has had very refractory and elevated high blood pressure so will not change any medications at this time. I have advised obtaining a blood pressure monitor for checking his blood pressure at home.  He had hospitalization at [facility] in January 2021 for hypertensive urgency blood pressure 219/107 mmHg associated with chest pain and abnormal troponin delta level.  Transthoracic echocardiogram showed similar findings with severe concentric left ventricular hypertrophy without significant outflow tract gradient or mitral regurgitation. He underwent cardiac MRI 01/27/20 showing 1. The left ventricular cavity size is lower limits of normal to small in cavity size. There is severe concentric LVH (max wall thickness = 3.4 cm at the mid inferoseptum). There is cavity obliteration during systole. The LVEF is calculated at 71%.  2. The right ventricle is lower limits of normal to small in size. There is RV apical hypertrophy.  Regional and global RV systolic function are normal.  3. The left atrium is moderately enlarged. The right atrium is mildly enlarged.  4. The aortic valve is trileaflet in morphology. There is no significant aortic valve stenosis or regurgitation.   5. There is chordal SAM (systolic anterior motion of the mitral valve). There is flow turbulence in the LVOT indicative of a dynamic LVOT gradient. The peak velocity is at least 2.4 m/sec (peak gradient 24 mm Hg at rest). There is associated mild mitral valve regurgitation.  6. Delayed enhancement imaging for viability is abnormal.  a. There is patchy, mid-myocardial hyperenhancement involving the mid inferoseptum. This pattern of hyperenhancement is observed in a patients with hypertrophic cardiomypathy. b. There is subendocardial hyperenhancement involving the mid-distal anterior wall, the distal septum, distal inferior wall, distal lateral wall, and apex, consistent with subendocardial myocardial infarction in the distribution of the LAD.  7. Adenosine stress perfusion imaging is abnormal. There are perfusion defects involving the basal-mid anterior wall, distal septum, and distal inferior wall, most consistent with inducible myocardial ischemia and a small amount of infarction. LGE 5.4%  8. There is no evidence  of an intracardiac thrombus.  9. There is a small pericardial  effusion adjacent to the inferior wall of the LV and the distal RV free wall. The pericardium is normal in thickness.  Cardiac cath on 01/28/20 showed LHC/RHC 01/28/20: Coronary arteries Dominance: right All coronaries normal (no CAD).  State: Baseline RA: 5 mmHg (mean) RV: 35/ 4 mmHg PA: 36/ 13 22 mmHg (mean) PCW: 11 mmHg (mean) AV O2: 5.5 vol% Cardiac output: 4.7 L/min Cardiac index: 2.3 L/min-m2 PVR: 2.3 Wood units  He had runs of NSVT on telemetry. EP was consulted and primary prevention ICD was implanted prior to discharge for possible HCM with extreme hypertrophy, presyncope and NSVT. Several adjustments to his antihypertensive medications were made including addition of carvedilol, amlodipine and increasing dosage of losartan.   05/08/2020 he was seen by myself and Dr. Graciela Husbands to establish EP care/device management He comes in today to be seen to establish with EP/device clinic follow up, this is much more convenient for him. He has not until now had his device checked since the hospital/implant  He has felt well.  Infrequently feels a little flutter in his heart beat, no CP, no SOB, no near syncope or syncope. He denies dizzy spells.  No shocks He starts his new job tomorrow and says his financial situation will be much better and he will be better able to get his meds, he is currently withouht his amlodipine and coreg. He denies any exertional incapacities.  He sleeps well, snores, but denies symptoms of orthopnea or PND.  Device and site were OK> Discussed importance of hie medicines Planned to see him back once on his medicines Encouraged to keep Dr. Regino Schultze for his HCM foloow up and management. Recommended sleep study Given community center for primary care  TODAY He feels well.  Unfortunately he was unable to fill his coreg and amldoipine, though today we made sure he had refills available to him and he will get started. No CP, palpitations, no rest SOB, denies  DOE' No dizziness, near syncope or syncope.  He asks about exercise, would like to gain some muscle mass    Device information BSci dual chamber ICD implanted 01/31/20, ([facility]), primary prevention   Past Medical History:  Diagnosis Date  . CHF (congestive heart failure) (HCC)   . COPD (chronic obstructive pulmonary disease) (HCC)   . Diabetes mellitus without complication (HCC)   . Hypertension     No past surgical history on file.  Current Outpatient Medications  Medication Sig Dispense Refill  . amLODipine (NORVASC) 10 MG tablet Take 1 tablet (10 mg total) by mouth daily. 30 tablet 11  . aspirin EC 81 MG tablet Take 81 mg by mouth daily.    Marland Kitchen atorvastatin (LIPITOR) 40 MG tablet Take 40 mg by mouth daily.    . carvedilol (COREG) 12.5 MG tablet Take 3 tablets (37.5 mg total) by mouth 2 (two) times daily. 180 tablet 11  . emtricitabine-tenofovir ([name]) 200-300 MG tablet Take 1 tablet by mouth daily.    Marland Kitchen glimepiride (AMARYL) 2 MG tablet Take 2 mg by mouth daily.    . hydrochlorothiazide (HYDRODIURIL) 25 MG tablet Take 25 mg by mouth daily.    . isosorbide mononitrate (IMDUR) 60 MG 24 hr tablet Take 60 mg by mouth daily.    Marland Kitchen losartan (COZAAR) 50 MG tablet Take 50 mg by mouth daily.     No current facility-administered medications for this visit.    Allergies:   Lactose and Lisinopril  Social History:  The patient  reports that he has been smoking. He has been smoking about 1.00 pack per day. He has never used smokeless tobacco. He reports current drug use. Drug: Marijuana. He reports that he does not drink alcohol.   Family History:  . Diabetes type II Mother  . Glaucoma Mother  . High blood pressure (Hypertension) Mother  . Stroke Maternal Grandmother    ROS:  Please see the history of present illness.  All other systems are reviewed and otherwise negative.   PHYSICAL EXAM:  VS:  BP (!) 172/78   Pulse 85   Ht 6\' 1"  (1.854 m)   Wt 182 lb 12.8 oz (82.9 kg)   SpO2  96%   BMI 24.12 kg/m  BMI: Body mass index is 24.12 kg/m. Well nourished, well developed, in no acute distress  HEENT: normocephalic, atraumatic  Neck: no JVD, carotid bruits or masses Cardiac:  RRR; 2-3/6SM, no rubs, or gallops Lungs:  CTA b/l, no wheezing, rhonchi or rales  Abd: soft, nontender MS: no deformity or atrophy Ext: no edema  Skin: warm and dry, no rash Neuro:  No gross deficits appreciated Psych: euthymic mood, full affect  ICD site: is stable, well healed, no erythema, no tethering or skin changes   EKG:  Not done today  ICD interrogation done today and reviewed by myself:  Battery and lead measurements are good 4NSVT episodes since his last visit 2 EGMs for review are true NSVT 19 beats 12 beats  Recent Labs: 12/10/2019: BUN 13; Creatinine, Ser 1.07; Hemoglobin 14.2; Platelets 235; Potassium 4.2; Sodium 141  No results found for requested labs within last 8760 hours.   CrCl cannot be calculated (Patient's most recent lab result is older than the maximum 21 days allowed.).   Wt Readings from Last 3 Encounters:  06/08/20 182 lb 12.8 oz (82.9 kg)  05/08/20 174 lb (78.9 kg)  12/22/19 180 lb 9.6 oz (81.9 kg)     Other studies reviewed: Additional studies/records reviewed today include: summarized above  ASSESSMENT AND PLAN:  1. ICD     Intact function  2. HCM 3. Chronic CHF (diastolic)     No symptoms or exam findings to suggest volume OL     He has gained some weight, though he reports indulging in sweets and increased intake     His heart logic score is up though footprint looks good     No changes for now  We discussed avoiding vigorous exercise and weight training As well as chest avoid over stress on leads  4. HTN      Uncontrolled     Not taking coreg or amlodipine     5. NSVT     Not new, no symptoms     He will get his coreg (and amlodipine) started today   I would like to see him back ON all of his medicines. He is OK coming for  a check in, will see him back in 6-8 weeks   Disposition: remotes Q 49mo, and as above otherwise    Current medicines are reviewed at length with the patient today.  The patient did not have any concerns regarding medicines.  0mo, PA-C 06/08/2020 4:09 PM     [facility] [address] [place] [phone] (office)  [phone] (fax)  Seen and examined Plan reviewed and importance of medications stressed Device/shock plan reviewed Will need sleep study

## 2020-06-08 ENCOUNTER — Ambulatory Visit (INDEPENDENT_AMBULATORY_CARE_PROVIDER_SITE_OTHER): Payer: 59 | Admitting: Physician Assistant

## 2020-06-08 ENCOUNTER — Encounter: Payer: Self-pay | Admitting: Physician Assistant

## 2020-06-08 ENCOUNTER — Other Ambulatory Visit: Payer: Self-pay

## 2020-06-08 VITALS — BP 172/78 | HR 85 | Ht 73.0 in | Wt 182.8 lb

## 2020-06-08 DIAGNOSIS — Z95810 Presence of automatic (implantable) cardiac defibrillator: Secondary | ICD-10-CM | POA: Diagnosis not present

## 2020-06-08 DIAGNOSIS — I422 Other hypertrophic cardiomyopathy: Secondary | ICD-10-CM | POA: Diagnosis not present

## 2020-06-08 DIAGNOSIS — I4729 Other ventricular tachycardia: Secondary | ICD-10-CM

## 2020-06-08 DIAGNOSIS — I5032 Chronic diastolic (congestive) heart failure: Secondary | ICD-10-CM

## 2020-06-08 DIAGNOSIS — I472 Ventricular tachycardia: Secondary | ICD-10-CM | POA: Diagnosis not present

## 2020-06-08 DIAGNOSIS — I10 Essential (primary) hypertension: Secondary | ICD-10-CM | POA: Diagnosis not present

## 2020-06-08 MED ORDER — CARVEDILOL 12.5 MG PO TABS: 37.5000 mg | ORAL_TABLET | Freq: Two times a day (BID) | ORAL | 11 refills | Status: DC

## 2020-06-08 MED ORDER — AMLODIPINE BESYLATE 10 MG PO TABS: 10.0000 mg | ORAL_TABLET | Freq: Every day | ORAL | 11 refills | Status: DC

## 2020-06-08 NOTE — Patient Instructions (Addendum)
Medication Instructions:  none *If you need a refill on your cardiac medications before your next appointment, please call your pharmacy*   Lab Work: none If you have labs (blood work) drawn today and your tests are completely normal, you will receive your results only by: Marland Kitchen MyChart Message (if you have MyChart) OR . A paper copy in the mail If you have any lab test that is abnormal or we need to change your treatment, we will call you to review the results.   Testing/Procedures: none   Follow-Up: At [facility], you and your health needs are our priority.  As part of our continuing mission to provide you with exceptional heart care, we have created designated Provider Care Teams.  These Care Teams include your primary Cardiologist (physician) and Advanced Practice Providers (APPs -  Physician Assistants and Nurse Practitioners) who all work together to provide you with the care you need, when you need it.   Your next appointment:   4-6 weeks  The format for your next appointment:   In Person  Provider:   Otilio Saber or Francis Dowse   Other Instructions Remote monitoring is used to monitor your ICD from home. This monitoring reduces the number of office visits required to check your device to one time per year. It allows [place] to keep an eye on the functioning of your device to ensure it is working properly. You are scheduled for a device check from home on 08/07/20. You may send your transmission at any time that day. If you have a wireless device, the transmission will be sent automatically. After your physician reviews your transmission, you will receive a postcard with your next transmission date.

## 2020-06-13 ENCOUNTER — Telehealth: Payer: Self-pay | Admitting: *Deleted

## 2020-06-13 NOTE — Telephone Encounter (Signed)
PA submitted to [facility] via fax for in lab sleep study.

## 2020-06-15 ENCOUNTER — Telehealth: Payer: Self-pay | Admitting: *Deleted

## 2020-06-15 NOTE — Telephone Encounter (Signed)
[id number] ref# [id number] code  Denied in lab study reason : medical necessity not met 2-5 business receive fax Tyson Babinski [phone] fax# Gwyndolyn Saxon from [facility]

## 2020-06-27 MED FILL — EMTRICITABINE-TENOFOVIR DF: 200-300 | 30 days supply | Qty: 30 | Fill #2

## 2020-07-04 NOTE — Progress Notes (Deleted)
Cardiology Office Note Date:  07/04/2020  Patient ID:  Johnny Schroeder, DOB 08-Nov-1966, MRN [id number] PCP:  Patient, No Pcp Per  Cardiologist:  Malvin Johns Dr. Wyline Mood Oct 2020 hospital consultation  >> Dr. Jettie Pagan EP: Dr. Graciela Husbands (new today)    Chief Complaint:  planned f/u  History of Present Illness: Johnny Schroeder is a 54 y.o. male with history of COPD, HTN , DM2, RBBB  He has had issus with poor BP control, it seems associated with medication noncompliance, (difficult work schedules, personal stressors).  He saw J. Cleaver, NP Dec 2020 had medication adjustment with plans to f/u with HTN clinic.    He was following with Dr. Regino Schultze at [facility], he last saw him 02/14/20, note reports  I reviewed his transthoracic echocardiogram and cardiac MRI. Although he has had longstanding refractory hypertension, the degree of his left ventricular hypertrophy as well as mild asymmetry suggest possible hypertrophic cardiomyopathy in addition to hypertensive heart disease.  He does not have significant outflow tract obstruction but rather appears to have cavity obliteration. No indication for septal reduction therapy at this time.  He now has primary prevention ICD implanted and will have follow-up by EP I have advised EKG and echocardiographic screening of his sister. He has no children.  2) hypertensive heart disease Blood pressures well controlled on current medical regimen. Although he is on several medications that would not typically be used in the setting of hypertrophic cardiomyopathy including amlodipine and diuretic therapy, he has had very refractory and elevated high blood pressure so will not change any medications at this time. I have advised obtaining a blood pressure monitor for checking his blood pressure at home.  He had hospitalization at [facility] in January 2021 for hypertensive urgency blood pressure 219/107 mmHg associated with chest pain and abnormal troponin delta level.  Transthoracic echocardiogram showed similar findings with severe concentric left ventricular hypertrophy without significant outflow tract gradient or mitral regurgitation. He underwent cardiac MRI 01/27/20 showing 1. The left ventricular cavity size is lower limits of normal to small in cavity size. There is severe concentric LVH (max wall thickness = 3.4 cm at the mid inferoseptum). There is cavity obliteration during systole. The LVEF is calculated at 71%.  2. The right ventricle is lower limits of normal to small in size. There is RV apical hypertrophy.  Regional and global RV systolic function are normal.  3. The left atrium is moderately enlarged. The right atrium is mildly enlarged.  4. The aortic valve is trileaflet in morphology. There is no significant aortic valve stenosis or regurgitation.   5. There is chordal SAM (systolic anterior motion of the mitral valve). There is flow turbulence in the LVOT indicative of a dynamic LVOT gradient. The peak velocity is at least 2.4 m/sec (peak gradient 24 mm Hg at rest). There is associated mild mitral valve regurgitation.  6. Delayed enhancement imaging for viability is abnormal.  a. There is patchy, mid-myocardial hyperenhancement involving the mid inferoseptum. This pattern of hyperenhancement is observed in a patients with hypertrophic cardiomypathy. b. There is subendocardial hyperenhancement involving the mid-distal anterior wall, the distal septum, distal inferior wall, distal lateral wall, and apex, consistent with subendocardial myocardial infarction in the distribution of the LAD.  7. Adenosine stress perfusion imaging is abnormal. There are perfusion defects involving the basal-mid anterior wall, distal septum, and distal inferior wall, most consistent with inducible myocardial ischemia and a small amount of infarction. LGE 5.4%  8. There is no evidence  of an intracardiac thrombus.  9. There is a small pericardial  effusion adjacent to the inferior wall of the LV and the distal RV free wall. The pericardium is normal in thickness.  Cardiac cath on 01/28/20 showed LHC/RHC 01/28/20: Coronary arteries Dominance: right All coronaries normal (no CAD).  State: Baseline RA: 5 mmHg (mean) RV: 35/ 4 mmHg PA: 36/ 13 22 mmHg (mean) PCW: 11 mmHg (mean) AV O2: 5.5 vol% Cardiac output: 4.7 L/min Cardiac index: 2.3 L/min-m2 PVR: 2.3 Wood units  He had runs of NSVT on telemetry. EP was consulted and primary prevention ICD was implanted prior to discharge for possible HCM with extreme hypertrophy, presyncope and NSVT. Several adjustments to his antihypertensive medications were made including addition of carvedilol, amlodipine and increasing dosage of losartan.   05/08/2020 he was seen by myself and Dr. Graciela Husbands to establish EP care/device management He comes in today to be seen to establish with EP/device clinic follow up, this is much more convenient for him. He has not until now had his device checked since the hospital/implant  He has felt well.  Infrequently feels a little flutter in his heart beat, no CP, no SOB, no near syncope or syncope. He denies dizzy spells.  No shocks He starts his new job tomorrow and says his financial situation will be much better and he will be better able to get his meds, he is currently withouht his amlodipine and coreg. He denies any exertional incapacities.  He sleeps well, snores, but denies symptoms of orthopnea or PND.  Device and site were OK> Discussed importance of hie medicines Planned to see him back once on his medicines Encouraged to keep Dr. Regino Schultze for his HCM foloow up and management. Recommended sleep study Given community center for primary care  06/08/2020 He feels well.  Unfortunately he was unable to fill his coreg and amldoipine, though today we made sure he had refills available to him and he will get started. No CP, palpitations, no rest SOB, denies  DOE No dizziness, near syncope or syncope. He asks about exercise, would like to gain some muscle mass  He was advised/urged to get his medicines refilled and instructed to avoid weight lifting and excessive exercise. Planned to come in on his medicines to assess his BP, aany any further NSVT/device findings. Was pending sleep study which appears to have subsequently been denied by his insurance.  *** taking meds? *** BP *** volume    Device information BSci dual chamber ICD implanted 01/31/20, ([facility]), primary prevention   Past Medical History:  Diagnosis Date  . CHF (congestive heart failure) (HCC)   . COPD (chronic obstructive pulmonary disease) (HCC)   . Diabetes mellitus without complication (HCC)   . Hypertension     No past surgical history on file.  Current Outpatient Medications  Medication Sig Dispense Refill  . amLODipine (NORVASC) 10 MG tablet Take 1 tablet (10 mg total) by mouth daily. 30 tablet 11  . aspirin EC 81 MG tablet Take 81 mg by mouth daily.    Marland Kitchen atorvastatin (LIPITOR) 40 MG tablet Take 40 mg by mouth daily.    . carvedilol (COREG) 12.5 MG tablet Take 3 tablets (37.5 mg total) by mouth 2 (two) times daily. 180 tablet 11  . emtricitabine-tenofovir ([name]) 200-300 MG tablet Take 1 tablet by mouth daily.    Marland Kitchen glimepiride (AMARYL) 2 MG tablet Take 2 mg by mouth daily.    . hydrochlorothiazide (HYDRODIURIL) 25 MG tablet Take 25 mg by  mouth daily.    . isosorbide mononitrate (IMDUR) 60 MG 24 hr tablet Take 60 mg by mouth daily.    Marland Kitchen losartan (COZAAR) 50 MG tablet Take 50 mg by mouth daily.     No current facility-administered medications for this visit.    Allergies:   Lactose and Lisinopril   Social History:  The patient  reports that he has been smoking. He has been smoking about 1.00 pack per day. He has never used smokeless tobacco. He reports current drug use. Drug: Marijuana. He reports that he does not drink alcohol.   Family History:  . Diabetes  type II Mother  . Glaucoma Mother  . High blood pressure (Hypertension) Mother  . Stroke Maternal Grandmother    ROS:  Please see the history of present illness.  All other systems are reviewed and otherwise negative.   PHYSICAL EXAM:  VS:  There were no vitals taken for this visit. BMI: There is no height or weight on file to calculate BMI. Well nourished, well developed, in no acute distress  HEENT: normocephalic, atraumatic  Neck: no JVD, carotid bruits or masses Cardiac:  *** RRR; 2-3/6SM, no rubs, or gallops Lungs:  *** CTA b/l, no wheezing, rhonchi or rales  Abd: soft, nontender MS: no deformity or atrophy Ext: *** no edema  Skin: warm and dry, no rash Neuro:  No gross deficits appreciated Psych: euthymic mood, full affect  *** ICD site: is stable, well healed, no erythema, no tethering or skin changes   EKG:  Not done today  ICD interrogation done today and reviewed by myself:  ***  Recent Labs: 12/10/2019: BUN 13; Creatinine, Ser 1.07; Hemoglobin 14.2; Platelets 235; Potassium 4.2; Sodium 141  No results found for requested labs within last 8760 hours.   CrCl cannot be calculated (Patient's most recent lab result is older than the maximum 21 days allowed.).   Wt Readings from Last 3 Encounters:  06/08/20 182 lb 12.8 oz (82.9 kg)  05/08/20 174 lb (78.9 kg)  12/22/19 180 lb 9.6 oz (81.9 kg)     Other studies reviewed: Additional studies/records reviewed today include: summarized above  ASSESSMENT AND PLAN:  1. ICD     *** Intact function  2. HCM 3. Chronic CHF (diastolic)     *** No symptoms or exam findings to suggest volume OL     *** He has gained some weight, though he reports indulging in sweets and increased intake     *** His heart logic score is up though footprint looks good     *** No changes for now  *** We discussed avoiding vigorous exercise and weight training As well as chest avoid over stress on leads  4. HTN     ***     5. NSVT      Not new, no symptoms     ***       Disposition: ***   Current medicines are reviewed at length with the patient today.  The patient did not have any concerns regarding medicines.  Norma Fredrickson, PA-C 07/04/2020 1:33 PM     [facility] [address] [place] [address] [phone] (office)  [phone] (fax)

## 2020-07-06 ENCOUNTER — Encounter: Payer: 59 | Admitting: Physician Assistant

## 2020-08-04 ENCOUNTER — Emergency Department (HOSPITAL_BASED_OUTPATIENT_CLINIC_OR_DEPARTMENT_OTHER): Payer: Self-pay

## 2020-08-04 ENCOUNTER — Encounter (HOSPITAL_COMMUNITY): Payer: Self-pay | Admitting: Emergency Medicine

## 2020-08-04 ENCOUNTER — Emergency Department (HOSPITAL_COMMUNITY): Payer: Self-pay

## 2020-08-04 ENCOUNTER — Emergency Department (HOSPITAL_COMMUNITY)
Admission: EM | Admit: 2020-08-04 | Discharge: 2020-08-04 | Disposition: A | Discharge status: Home / Self Care | Payer: Self-pay | Attending: Emergency Medicine | Admitting: Emergency Medicine

## 2020-08-04 DIAGNOSIS — F17200 Nicotine dependence, unspecified, uncomplicated: Secondary | ICD-10-CM | POA: Insufficient documentation

## 2020-08-04 DIAGNOSIS — Z7982 Long term (current) use of aspirin: Secondary | ICD-10-CM | POA: Insufficient documentation

## 2020-08-04 DIAGNOSIS — Z79899 Other long term (current) drug therapy: Secondary | ICD-10-CM | POA: Insufficient documentation

## 2020-08-04 DIAGNOSIS — I739 Peripheral vascular disease, unspecified: Secondary | ICD-10-CM | POA: Insufficient documentation

## 2020-08-04 DIAGNOSIS — R202 Paresthesia of skin: Secondary | ICD-10-CM | POA: Insufficient documentation

## 2020-08-04 DIAGNOSIS — M79605 Pain in left leg: Secondary | ICD-10-CM | POA: Insufficient documentation

## 2020-08-04 DIAGNOSIS — M79672 Pain in left foot: Secondary | ICD-10-CM | POA: Insufficient documentation

## 2020-08-04 DIAGNOSIS — E119 Type 2 diabetes mellitus without complications: Secondary | ICD-10-CM | POA: Insufficient documentation

## 2020-08-04 DIAGNOSIS — M5432 Sciatica, left side: Secondary | ICD-10-CM

## 2020-08-04 DIAGNOSIS — I10 Essential (primary) hypertension: Secondary | ICD-10-CM | POA: Insufficient documentation

## 2020-08-04 DIAGNOSIS — I509 Heart failure, unspecified: Secondary | ICD-10-CM | POA: Insufficient documentation

## 2020-08-04 LAB — CBC
HCT: 37.4 % — ABNORMAL LOW (ref 39.0–52.0)
Hemoglobin: 12.4 g/dL — ABNORMAL LOW (ref 13.0–17.0)
MCH: 32.6 pg (ref 26.0–34.0)
MCHC: 33.2 g/dL (ref 30.0–36.0)
MCV: 98.4 fL (ref 80.0–100.0)
Platelets: 198 10*3/uL (ref 150–400)
RBC: 3.8 MIL/uL — ABNORMAL LOW (ref 4.22–5.81)
RDW: 13.1 % (ref 11.5–15.5)
WBC: 3.6 10*3/uL — ABNORMAL LOW (ref 4.0–10.5)
nRBC: 0 % (ref 0.0–0.2)

## 2020-08-04 LAB — URINALYSIS, ROUTINE W REFLEX MICROSCOPIC
Glucose, UA: NEGATIVE mg/dL
Hgb urine dipstick: NEGATIVE
Ketones, ur: NEGATIVE mg/dL
Leukocytes,Ua: NEGATIVE
Nitrite: NEGATIVE
Protein, ur: 30 mg/dL — AB
Specific Gravity, Urine: 1.03 (ref 1.005–1.030)
pH: 5 (ref 5.0–8.0)

## 2020-08-04 LAB — BASIC METABOLIC PANEL
Anion gap: 12 (ref 5–15)
BUN: 22 mg/dL — ABNORMAL HIGH (ref 6–20)
CO2: 22 mmol/L (ref 22–32)
Calcium: 9.1 mg/dL (ref 8.9–10.3)
Chloride: 102 mmol/L (ref 98–111)
Creatinine, Ser: 1.49 mg/dL — ABNORMAL HIGH (ref 0.61–1.24)
GFR calc Af Amer: >60 mL/min (ref >60)
GFR calc non Af Amer: 53 mL/min — ABNORMAL LOW (ref >60)
Glucose, Bld: 199 mg/dL — ABNORMAL HIGH (ref 70–99)
Potassium: 4.2 mmol/L (ref 3.5–5.1)
Sodium: 136 mmol/L (ref 135–145)

## 2020-08-04 LAB — CBG MONITORING, ED: Glucose-Capillary: 228 mg/dL — ABNORMAL HIGH (ref 70–99)

## 2020-08-04 MED ORDER — ACETAMINOPHEN 500 MG PO TABS: 1000.0000 mg | ORAL_TABLET | Freq: Four times a day (QID) | ORAL | 0 refills | Status: DC | PRN

## 2020-08-04 MED ORDER — ACETAMINOPHEN 500 MG PO TABS
1000.0000 mg | ORAL_TABLET | Freq: Once | ORAL | Status: AC
  Administered 2020-08-04: 1000 mg via ORAL
  Filled 2020-08-04: qty 2

## 2020-08-04 MED ORDER — CYCLOBENZAPRINE HCL 10 MG PO TABS
10.0000 mg | ORAL_TABLET | Freq: Two times a day (BID) | ORAL | 0 refills | Status: DC | PRN
Start: 2020-08-04 — End: 2021-08-09

## 2020-08-04 MED ORDER — SODIUM CHLORIDE 0.9% FLUSH: 3.0000 mL | Freq: Once | INTRAVENOUS | Status: DC

## 2020-08-04 MED ORDER — METHYLPREDNISOLONE 4 MG PO TBPK
ORAL_TABLET | ORAL | 0 refills | Status: DC
Start: 2020-08-04 — End: 2021-08-07

## 2020-08-04 NOTE — Progress Notes (Signed)
ABI  has been completed.   Preliminary results in CV Proc.   Blanch Media 08/04/2020 6:25 PM

## 2020-08-04 NOTE — ED Provider Notes (Signed)
[facility] EMERGENCY DEPARTMENT Provider Note   CSN: [id number] Arrival date & time: 08/04/20  1237     History Chief Complaint  Patient presents with  . Leg Pain  . Dizziness    Johnny Schroeder is a 54 y.o. male.  HPI Patient reports that he been having ongoing problems with left leg pain and sometimes numbness.  He reports pain starts in the hip or lower back area.  It can radiate down to the back of the leg and sometimes the foot.  Patient reports that typically this has improved with some rest.  He was having problems with this last year but then got some improvement with weight loss and exercise.  He reports now is occurring more frequently.  He reports that he is starting to get episodes of burning and numbness of his foot.  Typically the symptoms improve with sitting down and resting.  He reports episode was fairly severe earlier today.  He was working and gotten the point where his foot felt pretty numb.  In addition of this, he is experiencing burning pain.  He reports now he has some slight sensation of numbness in the foot but the pain is much better.  Denies he is having any problems with abdominal pain.  No upper extremity symptoms.  No problems with significant upper back or neck pain.  No headaches.  Patient denies he has any prior diagnosis regarding his back and leg pain.  He reports he does have history of cardiomyopathy and has a pacer defibrillator.  He takes a daily aspirin.    Past Medical History:  Diagnosis Date  . CHF (congestive heart failure) (HCC)   . COPD (chronic obstructive pulmonary disease) (HCC)   . Diabetes mellitus without complication (HCC)   . Hypertension     Patient Active Problem List   Diagnosis Date Noted  . Essential hypertension 10/26/2019    History reviewed. No pertinent surgical history.     No family history on file.  Social History   Tobacco Use  . Smoking status: Current Every Day Smoker    Packs/day:  1.00  . Smokeless tobacco: Never Used  Vaping Use  . Vaping Use: Never used  Substance Use Topics  . Alcohol use: No  . Drug use: Yes    Types: Marijuana    Home Medications Prior to Admission medications   Medication Sig Start Date End Date Taking? Authorizing Provider  amLODipine (NORVASC) 10 MG tablet Take 1 tablet (10 mg total) by mouth daily. 06/08/20  Yes Sheilah Pigeon, PA-C  aspirin EC 81 MG tablet Take 81 mg by mouth daily.   Yes [provider]  atorvastatin (LIPITOR) 40 MG tablet Take 40 mg by mouth daily. 03/03/20 03/03/21 Yes [provider]  carvedilol (COREG) 12.5 MG tablet Take 3 tablets (37.5 mg total) by mouth 2 (two) times daily. 06/08/20 06/08/21 Yes Sheilah Pigeon, PA-C  emtricitabine-tenofovir ([name]) 200-300 MG tablet Take 1 tablet by mouth daily. 06/02/20  Yes [provider]  hydrochlorothiazide (HYDRODIURIL) 25 MG tablet Take 25 mg by mouth daily. 03/03/20  Yes [provider]  isosorbide mononitrate (IMDUR) 60 MG 24 hr tablet Take 60 mg by mouth daily. 03/03/20 03/03/21 Yes [provider]  losartan (COZAAR) 50 MG tablet Take 50 mg by mouth daily. 03/03/20 03/03/21 Yes [provider]  acetaminophen (TYLENOL) 500 MG tablet Take 2 tablets (1,000 mg total) by mouth every 6 (six) hours as needed. 08/04/20  [name], MD  cyclobenzaprine (FLEXERIL) 10 MG tablet Take 1 tablet (10 mg total) by mouth 2 (two) times daily as needed for muscle spasms. 08/04/20   [name], MD  methylPREDNISolone (MEDROL DOSEPAK) 4 MG TBPK tablet Per dose pack instruction 08/04/20   [name], MD    Allergies    Lactose and Lisinopril  Review of Systems   Review of Systems 10 systems reviewed and negative except as per HPI Physical Exam Updated Vital Signs BP (!) 97/52 (BP Location: Left Arm)   Pulse (!) 59   Temp 98.6 F (37 C)   Resp 14   SpO2 99%   Physical Exam Constitutional:      Comments: Alert nontoxic  clinically well in appearance.  Eyes:     Extraocular Movements: Extraocular movements intact.  Cardiovascular:     Rate and Rhythm: Normal rate and regular rhythm.     Comments: 2 out of 6 systolic ejection murmur. Pulmonary:     Effort: Pulmonary effort is normal.     Breath sounds: Normal breath sounds.  Abdominal:     General: There is no distension.     Palpations: Abdomen is soft.     Tenderness: There is no abdominal tenderness. There is no guarding.  Musculoskeletal:     Cervical back: Neck supple.     Comments: Lower extremities are symmetric.  Patient does not have peripheral edema.  He has good muscular formation without atrophy.  No effusions at the joints.  No pain with range of motion at the hip knee or ankle bilaterally.  Negative straight leg raise.  Both feet are warm and dry. To palpation, patient has a strong, 2+ dorsalis pedis and posterior tibialis pulse on the right.  The left DP and posterior tibialis are palpable but much softer than the right.  Femoral pulses as well have a strong right femoral and a 1+ left femoral.  No masses fullness or tenderness within the femoral regions.  Abdomen is completely nontender.  Neurological:     General: No focal deficit present.     Mental Status: He is oriented to person, place, and time.     Comments: Patient is neurologically intact.  All movements are coordinated purposeful symmetric.  There is not a strength differential for testing of the lower extremities.  Patient can stand and ambulate with a symmetric gait.  To light touch he has a vague perception of the left foot having slightly different sensory feeling.  Psychiatric:        Mood and Affect: Mood normal.      ED Results / Procedures / Treatments   Labs (all labs ordered are listed, but only abnormal results are displayed) Labs Reviewed  BASIC METABOLIC PANEL - Abnormal; Notable for the following components:      Result Value   Glucose, Bld 199 (*)    BUN 22 (*)     Creatinine, Ser 1.49 (*)    GFR calc non Af Amer 53 (*)    All other components within normal limits  CBC - Abnormal; Notable for the following components:   WBC 3.6 (*)    RBC 3.80 (*)    Hemoglobin 12.4 (*)    HCT 37.4 (*)    All other components within normal limits  URINALYSIS, ROUTINE W REFLEX MICROSCOPIC - Abnormal; Notable for the following components:   Color, Urine AMBER (*)    Bilirubin Urine SMALL (*)    Protein, ur 30 (*)  Bacteria, UA RARE (*)    All other components within normal limits  CBG MONITORING, ED - Abnormal; Notable for the following components:   Glucose-Capillary 228 (*)    All other components within normal limits    EKG EKG Interpretation  Date/Time:  Friday August 04 2020 12:47:22 EDT Ventricular Rate:  69 PR Interval:  202 QRS Duration: 148 QT Interval:  444 QTC Calculation: 475 R Axis:   -87 Text Interpretation: Sinus rhythm with occasional ventricular-paced complexes Biatrial enlargement Left axis deviation Right bundle branch block Left ventricular hypertrophy with repolarization abnormality ( R in aVL , Romhilt-Estes ) Inferior infarct , age undetermined Anterolateral infarct , age undetermined Abnormal ECG no sig change from previous Confirmed by [name] [phone]) on 08/04/2020 5:29:33 PM   Radiology DG Lumbar Spine Complete  Result Date: 08/04/2020 CLINICAL DATA:  Radiculopathy. EXAM: LUMBAR SPINE - COMPLETE 4+ VIEW COMPARISON:  None. FINDINGS: There is no evidence of lumbar spine fracture. Alignment is normal. Intervertebral disc spaces are maintained. IMPRESSION: Negative. Electronically Signed   By: Katherine Mantle M.D.   On: 08/04/2020 19:54   VAS [place] ABI WITH/WO TBI  Result Date: 08/04/2020 LOWER EXTREMITY DOPPLER STUDY Indications: Numbness & tingling. High Risk Factors: Hypertension, Diabetes.  Comparison Study: no prior Performing Technologist: Blanch Media RVS  Examination Guidelines: A complete evaluation includes at  minimum, Doppler waveform signals and systolic blood pressure reading at the level of bilateral brachial, anterior tibial, and posterior tibial arteries, when vessel segments are accessible. Bilateral testing is considered an integral part of a complete examination. Photoelectric Plethysmograph (PPG) waveforms and toe systolic pressure readings are included as required and additional duplex testing as needed. Limited examinations for reoccurring indications may be performed as noted.  ABI Findings: +---------+------------------+-----+---------+--------+ Right    Rt Pressure (mmHg)IndexWaveform Comment  +---------+------------------+-----+---------+--------+ Brachial 146                    triphasic         +---------+------------------+-----+---------+--------+ PTA      142               0.97 triphasic         +---------+------------------+-----+---------+--------+ DP       132               0.90 triphasic         +---------+------------------+-----+---------+--------+ Great Toe99                0.68                   +---------+------------------+-----+---------+--------+ +---------+------------------+-----+---------+-------+ Left     Lt Pressure (mmHg)IndexWaveform Comment +---------+------------------+-----+---------+-------+ Brachial 142                    triphasic        +---------+------------------+-----+---------+-------+ PTA      105               0.72 biphasic         +---------+------------------+-----+---------+-------+ DP       95                0.65 biphasic         +---------+------------------+-----+---------+-------+ Great Toe69                0.47                  +---------+------------------+-----+---------+-------+ +-------+-----------+-----------+------------+------------+ ABI/TBIToday's ABIToday's TBIPrevious ABIPrevious TBI +-------+-----------+-----------+------------+------------+ Right  0.97  0.68                                 +-------+-----------+-----------+------------+------------+ Left   0.72       0.47                                +-------+-----------+-----------+------------+------------+  Summary: Right: Resting right ankle-brachial index is within normal range. No evidence of significant right lower extremity arterial disease. The right toe-brachial index is abnormal. Left: Resting left ankle-brachial index indicates moderate left lower extremity arterial disease. The left toe-brachial index is abnormal.  *See table(s) above for measurements and observations.     Preliminary     Procedures Procedures (including critical care time)  Medications Ordered in ED Medications  sodium chloride flush (NS) 0.9 % injection 3 mL (has no administration in time range)  acetaminophen (TYLENOL) tablet 1,000 mg (1,000 mg Oral Given 08/04/20 1858)    ED Course  I have reviewed the triage vital signs and the nursing notes.  Pertinent labs & imaging results that were available during my care of the patient were reviewed by me and considered in my medical decision making (see chart for details).  Clinical Course as of Aug 05 2043  Caleen EssexFri Aug 04, 2020  1908 Consult:Dr. Myra GianottiBrabham will get patient into clinic for workup within next 2 weeks.   [MP]    Clinical Course User Index [MP] Arby BarrettePfeiffer, Angelis Gates, MD   MDM Rules/Calculators/A&P                          Patient symptoms are very suggestive of sciatica.  Cannot get MRI due to pacer.  Patient is neurologically intact.  He is ambulatory without difficulty.  No weakness of the lower extremity.  At this time will start a Medrol Dosepak and pain control.  Patient will need follow-up evaluation.  Return precautions have been reviewed. Also, on exam patient had a perceptibly softer pulse on the left.  Pulse is present to manual palpation.  ABIs obtained and consultation to vascular surgery obtained.  Patient's limb is in good condition at this time.  There are no  chronic wounds, no edema, no skin changes.  Patient will continue his aspirin and regular medications for hypertension and hyperlipidemia.  Plan is for follow-up within 2 weeks with vascular surgery and strict return precautions reviewed. Final Clinical Impression(s) / ED Diagnoses Final diagnoses:  Sciatica of left side  PAD (peripheral artery disease) (HCC)    Rx / DC Orders ED Discharge Orders         Ordered    methylPREDNISolone (MEDROL DOSEPAK) 4 MG TBPK tablet     Discontinue  Reprint     08/04/20 2040    cyclobenzaprine (FLEXERIL) 10 MG tablet  2 times daily PRN     Discontinue  Reprint     08/04/20 2040    acetaminophen (TYLENOL) 500 MG tablet  Every 6 hours PRN     Discontinue  Reprint     08/04/20 2040           Arby BarrettePfeiffer, Vaiden Adames, MD 08/04/20 2046

## 2020-08-04 NOTE — ED Triage Notes (Signed)
Pt reports L leg numbness and pain that began yesterday while he was walking home. Also endorses some dizziness that began around 11am today. Pt a/ox4, speech clear, move all limbs equally, face symmetrical.

## 2020-08-04 NOTE — Discharge Instructions (Addendum)
1.  Take Medrol Dosepak as prescribed.  Monitor your blood sugars at home.  If blood sugars are elevating, discontinue the Medrol Dosepak and see your doctor.  This medication is to help with inflammation in the back that is likely contributing to sciatica.  You may also take Flexeril 10 mg twice a day for muscle spasms.  Take acetaminophen extra strength every 6 hours as needed for pain control. 2.  You need to be seen by the vascular surgeon.  They will schedule an appointment within the next 2 weeks for recheck.  You have a diminished pulse in your left foot.  At this time, the most important thing to do is continue your aspirin and your regularly prescribed medications to control hypertension.  Quit smoking if you smoke.  Return to the emergency department immediately if your foot feels cool, numb, severe pain or other concerning symptoms. 3.  You should have a family doctor as well as your cardiologist.  You may schedule appointment with [facility] or use the referral number in your discharge instructions to help find a physician accepting new patients.  Your cardiology group might also be able to recommend a primary care physician for you.

## 2020-08-07 ENCOUNTER — Ambulatory Visit: Payer: 59 | Admitting: *Deleted

## 2020-08-07 DIAGNOSIS — Z95810 Presence of automatic (implantable) cardiac defibrillator: Secondary | ICD-10-CM

## 2020-08-07 LAB — CUP PACEART REMOTE DEVICE CHECK
Battery Remaining Longevity: 156 mo
Battery Remaining Percentage: 100 %
Brady Statistic RA Percent Paced: 13 %
Brady Statistic RV Percent Paced: 0 %
Date Time Interrogation Session: 20210809021200
HighPow Impedance: 48 Ohm
Implantable Lead Implant Date: 20210201
Implantable Lead Implant Date: 20210201
Implantable Lead Location: 753862
Implantable Lead Location: 753862
Implantable Lead Model: 273
Implantable Lead Model: 7841
Implantable Lead Serial Number: 1044519
Implantable Lead Serial Number: 109249
Implantable Pulse Generator Implant Date: 20210201
Lead Channel Impedance Value: 478 Ohm
Lead Channel Impedance Value: 668 Ohm
Lead Channel Pacing Threshold Amplitude: 0.4 V
Lead Channel Pacing Threshold Amplitude: 0.7 V
Lead Channel Pacing Threshold Pulse Width: 0.4 ms
Lead Channel Pacing Threshold Pulse Width: 0.4 ms
Lead Channel Setting Pacing Amplitude: 2 V
Lead Channel Setting Pacing Amplitude: 2 V
Lead Channel Setting Pacing Pulse Width: 0.4 ms
Lead Channel Setting Sensing Sensitivity: 0.6 mV
Pulse Gen Serial Number: 593625

## 2020-08-08 ENCOUNTER — Encounter: Payer: Self-pay | Admitting: Vascular Surgery

## 2020-08-08 ENCOUNTER — Other Ambulatory Visit: Payer: Self-pay

## 2020-08-08 ENCOUNTER — Ambulatory Visit (INDEPENDENT_AMBULATORY_CARE_PROVIDER_SITE_OTHER): Payer: Self-pay | Admitting: Vascular Surgery

## 2020-08-08 VITALS — BP 129/61 | HR 70 | Temp 98.1°F | Resp 18 | Ht 73.0 in | Wt 186.7 lb

## 2020-08-08 DIAGNOSIS — I739 Peripheral vascular disease, unspecified: Secondary | ICD-10-CM

## 2020-08-08 NOTE — Progress Notes (Signed)
[facility] [place]  Patient name: Johnny Schroeder MRN: [id number] DOB: [birth year] Sex: male  REASON FOR CONSULT: Evaluation left leg discomfort  HPI: Johnny Schroeder is a 54 y.o. male, who is here today for evaluation of left leg discomfort.  He presented to the emergency department on 8 6 complaining of left leg numbness and tingling.  He reports that he had pain that is not associated with walking.  Evaluation by the emergency room physician revealed palpable pedal pulses bilaterally.  Noninvasive studies were obtained and this showed normal right ankle arm index and slight abnormal flow in the left with an ankle index 0.72.  He is seen for follow-up.  He has no history of tissue loss.  No true calf claudication type symptoms.  He reports this occurs in his hips and buttocks and can continue throughout his entire leg.  No specific right leg symptoms.  Does have a history of ICD placement in January of this year.  He is unclear as to the reason for placement  Past Medical History:  Diagnosis Date  . CHF (congestive heart failure) (HCC)   . COPD (chronic obstructive pulmonary disease) (HCC)   . Diabetes mellitus without complication (HCC)   . Hypertension     History reviewed. No pertinent family history.  SOCIAL HISTORY: Social History   Socioeconomic History  . Marital status: Divorced    Spouse name: Not on file  . Number of children: Not on file  . Years of education: Not on file  . Highest education level: Not on file  Occupational History  . Not on file  Tobacco Use  . Smoking status: Current Every Day Smoker    Packs/day: 1.00  . Smokeless tobacco: Never Used  Vaping Use  . Vaping Use: Never used  Substance and Sexual Activity  . Alcohol use: No  . Drug use: Yes    Types: Marijuana  . Sexual activity: Not on file  Other Topics Concern  . Not on file  Social History Narrative  . Not on file   Social  Determinants of Health   Financial Resource Strain:   . Difficulty of Paying Living Expenses:   Food Insecurity:   . Worried About Programme researcher, broadcasting/film/video in the Last Year:   . Barista in the Last Year:   Transportation Needs:   . Freight forwarder (Medical):   Marland Kitchen Lack of Transportation (Non-Medical):   Physical Activity:   . Days of Exercise per Week:   . Minutes of Exercise per Session:   Stress:   . Feeling of Stress :   Social Connections:   . Frequency of Communication with Friends and Family:   . Frequency of Social Gatherings with Friends and Family:   . Attends Religious Services:   . Active Member of Clubs or Organizations:   . Attends Banker Meetings:   Marland Kitchen Marital Status:   Intimate Partner Violence:   . Fear of Current or Ex-Partner:   . Emotionally Abused:   Marland Kitchen Physically Abused:   . Sexually Abused:     Allergies  Allergen Reactions  . Lactose Other (See Comments)    GI upset  . Lisinopril Diarrhea and Other (See Comments)    Joint pain    Current Outpatient Medications  Medication Sig Dispense Refill  . acetaminophen (TYLENOL) 500 MG tablet Take 2 tablets (1,000 mg total) by mouth every 6 (six) hours as needed. 30 tablet 0  .  amLODipine (NORVASC) 10 MG tablet Take 1 tablet (10 mg total) by mouth daily. 30 tablet 11  . aspirin EC 81 MG tablet Take 81 mg by mouth daily.    Marland Kitchen atorvastatin (LIPITOR) 40 MG tablet Take 40 mg by mouth daily.    . carvedilol (COREG) 12.5 MG tablet Take 3 tablets (37.5 mg total) by mouth 2 (two) times daily. 180 tablet 11  . cyclobenzaprine (FLEXERIL) 10 MG tablet Take 1 tablet (10 mg total) by mouth 2 (two) times daily as needed for muscle spasms. 20 tablet 0  . emtricitabine-tenofovir ([name]) 200-300 MG tablet Take 1 tablet by mouth daily.    . hydrochlorothiazide (HYDRODIURIL) 25 MG tablet Take 25 mg by mouth daily.    . isosorbide mononitrate (IMDUR) 60 MG 24 hr tablet Take 60 mg by mouth daily.    Marland Kitchen  losartan (COZAAR) 50 MG tablet Take 50 mg by mouth daily.    . methylPREDNISolone (MEDROL DOSEPAK) 4 MG TBPK tablet Per dose pack instruction 21 tablet 0   No current facility-administered medications for this visit.    REVIEW OF SYSTEMS:  [X]  denotes positive finding, [ ]  denotes negative finding Cardiac  Comments:  Chest pain or chest pressure:    Shortness of breath upon exertion:    Short of breath when lying flat:    Irregular heart rhythm:        Vascular    Pain in calf, thigh, or hip brought on by ambulation:    Pain in feet at night that wakes you up from your sleep:     Blood clot in your veins:    Leg swelling:         Pulmonary    Oxygen at home:    Productive cough:     Wheezing:         Neurologic    Sudden weakness in arms or legs:     Sudden numbness in arms or legs:     Sudden onset of difficulty speaking or slurred speech:    Temporary loss of vision in one eye:     Problems with dizziness:         Gastrointestinal    Blood in stool:     Vomited blood:         Genitourinary    Burning when urinating:     Blood in urine:        Psychiatric    Major depression:         Hematologic    Bleeding problems:    Problems with blood clotting too easily:        Skin    Rashes or ulcers:        Constitutional    Fever or chills:      PHYSICAL EXAM: Vitals:   08/08/20 1524  BP: 129/61  Pulse: 70  Resp: 18  Temp: 98.1 F (36.7 C)  TempSrc: Temporal  SpO2: 96%  Weight: 186 lb 11.2 oz (84.7 kg)  Height: 6\' 1"  (1.854 m)    GENERAL: The patient is a well-nourished male, in no acute distress. The vital signs are documented above. CARDIOVASCULAR: 2+ radial pulses bilaterally.  Carotid arteries without bruits bilaterally.  Harsh heart murmur.  2+ femoral pulses.  1-2+ left dorsalis pedis pulse and 2+ right dorsalis pedis pulse PULMONARY: There is good air exchange  ABDOMEN: Soft and non-tender  MUSCULOSKELETAL: There are no major deformities or  cyanosis. NEUROLOGIC: No focal weakness or paresthesias are detected. SKIN:  There are no ulcers or rashes noted. PSYCHIATRIC: The patient has a normal affect.  DATA:  Noninvasive studies from 08/04/2020 reviewed showing normal ankle arm index in the right and diminished at 0.72 on the left  MEDICAL ISSUES: Had a long discussion with the patient regarding this.  His symptoms are not consistent with claudication.  His diagnosis in the emergency room was no sciatic pain and this does seem appropriate he clearly does not have any limb threatening ischemia.  He was reassured with this and will see [place] again on an as-needed basis   Larina Earthly, MD [facility] Tel [phone] Pager [phone]

## 2020-08-08 NOTE — Progress Notes (Signed)
Remote ICD transmission.   

## 2020-08-10 ENCOUNTER — Encounter: Payer: 59 | Admitting: Physician Assistant

## 2020-08-21 NOTE — Progress Notes (Deleted)
Electrophysiology Office Note Date: 08/21/2020  ID:  Johnny Schroeder, DOB [birth year], MRN [id number]  PCP: Patient, No Pcp Per Primary Cardiologist: Armanda Magic, MD Electrophysiologist: Dr. Graciela Husbands ([place])  CC: Routine ICD follow-up  Johnny Schroeder is a 54 y.o. male seen today for Dr. Graciela Husbands for routine electrophysiology followup.  Since last being seen in our clinic the patient reports doing ***.  he denies chest pain, palpitations, dyspnea, PND, orthopnea, nausea, vomiting, dizziness, syncope, edema, weight gain, or early satiety. {He/she (caps):30048} has not had ICD shocks.   See below for brief history from [facility] including cMRI 01/27/2020.  Device History: BSci dual chamber ICD implanted 01/31/20, ([facility]), primary prevention  Past Medical History:  Diagnosis Date  . CHF (congestive heart failure) (HCC)   . COPD (chronic obstructive pulmonary disease) (HCC)   . Diabetes mellitus without complication (HCC)   . Hypertension    No past surgical history on file.  Current Outpatient Medications  Medication Sig Dispense Refill  . acetaminophen (TYLENOL) 500 MG tablet Take 2 tablets (1,000 mg total) by mouth every 6 (six) hours as needed. 30 tablet 0  . amLODipine (NORVASC) 10 MG tablet Take 1 tablet (10 mg total) by mouth daily. 30 tablet 11  . aspirin EC 81 MG tablet Take 81 mg by mouth daily.    Marland Kitchen atorvastatin (LIPITOR) 40 MG tablet Take 40 mg by mouth daily.    . carvedilol (COREG) 12.5 MG tablet Take 3 tablets (37.5 mg total) by mouth 2 (two) times daily. 180 tablet 11  . cyclobenzaprine (FLEXERIL) 10 MG tablet Take 1 tablet (10 mg total) by mouth 2 (two) times daily as needed for muscle spasms. 20 tablet 0  . emtricitabine-tenofovir ([name]) 200-300 MG tablet Take 1 tablet by mouth daily.    . hydrochlorothiazide (HYDRODIURIL) 25 MG tablet Take 25 mg by mouth daily.    . isosorbide mononitrate (IMDUR) 60 MG 24 hr tablet Take 60 mg by mouth daily.    Marland Kitchen losartan (COZAAR) 50 MG  tablet Take 50 mg by mouth daily.    . methylPREDNISolone (MEDROL DOSEPAK) 4 MG TBPK tablet Per dose pack instruction 21 tablet 0   No current facility-administered medications for this visit.    Allergies:   Lactose and Lisinopril   Social History: Social History   Socioeconomic History  . Marital status: Divorced    Spouse name: Not on file  . Number of children: Not on file  . Years of education: Not on file  . Highest education level: Not on file  Occupational History  . Not on file  Tobacco Use  . Smoking status: Current Every Day Smoker    Packs/day: 1.00  . Smokeless tobacco: Never Used  Vaping Use  . Vaping Use: Never used  Substance and Sexual Activity  . Alcohol use: No  . Drug use: Yes    Types: Marijuana  . Sexual activity: Not on file  Other Topics Concern  . Not on file  Social History Narrative  . Not on file   Social Determinants of Health   Financial Resource Strain:   . Difficulty of Paying Living Expenses: Not on file  Food Insecurity:   . Worried About Programme researcher, broadcasting/film/video in the Last Year: Not on file  . Ran Out of Food in the Last Year: Not on file  Transportation Needs:   . Lack of Transportation (Medical): Not on file  . Lack of Transportation (Non-Medical): Not on file  Physical Activity:   .  Days of Exercise per Week: Not on file  . Minutes of Exercise per Session: Not on file  Stress:   . Feeling of Stress : Not on file  Social Connections:   . Frequency of Communication with Friends and Family: Not on file  . Frequency of Social Gatherings with Friends and Family: Not on file  . Attends Religious Services: Not on file  . Active Member of Clubs or Organizations: Not on file  . Attends Banker Meetings: Not on file  . Marital Status: Not on file  Intimate Partner Violence:   . Fear of Current or Ex-Partner: Not on file  . Emotionally Abused: Not on file  . Physically Abused: Not on file  . Sexually Abused: Not on file      Family History: No family history on file.  Review of Systems: All other systems reviewed and are otherwise negative except as noted above.   Physical Exam: There were no vitals filed for this visit.   GEN- The patient is well appearing, alert and oriented x 3 today.   HEENT: normocephalic, atraumatic; sclera clear, conjunctiva pink; hearing intact; oropharynx clear; neck supple, no JVP Lymph- no cervical lymphadenopathy Lungs- Clear to ausculation bilaterally, normal work of breathing.  No wheezes, rales, rhonchi Heart- Regular rate and rhythm, no murmurs, rubs or gallops, PMI not laterally displaced GI- soft, non-tender, non-distended, bowel sounds present, no hepatosplenomegaly Extremities- no clubbing or cyanosis. No edema; DP/PT/radial pulses 2+ bilaterally MS- no significant deformity or atrophy Skin- warm and dry, no rash or lesion; ICD pocket well healed Psych- euthymic mood, full affect Neuro- strength and sensation are intact  ICD interrogation- reviewed in detail today,  See PACEART report  EKG:  EKG {ACTION; IS/IS [id number]:[id number]} ordered today. The ekg ordered today shows ***  Recent Labs: 08/04/2020: BUN 22; Creatinine, Ser 1.49; Hemoglobin 12.4; Platelets 198; Potassium 4.2; Sodium 136   Wt Readings from Last 3 Encounters:  08/08/20 186 lb 11.2 oz (84.7 kg)  06/08/20 182 lb 12.8 oz (82.9 kg)  05/08/20 174 lb (78.9 kg)     Other studies Reviewed: Additional studies/ records that were reviewed today include:   He had hospitalization at [facility] in January 2021 for hypertensive urgency blood pressure 219/107 mmHg associated with chest pain and abnormal troponin delta level. Transthoracic echocardiogram showed similar findings with severe concentric left ventricular hypertrophy without significant outflow tract gradient or mitral regurgitation. He underwent cardiac MRI 01/27/20 showing 1. The left ventricular cavity size is lower limits of normal to  small in cavity size. There is severe concentric LVH (max wall thickness = 3.4 cm at the mid inferoseptum). There is cavity obliteration during systole. The LVEF is calculated at 71%. 2. The right ventricle is lower limits of normal to small in size. There is RV apical hypertrophy.  Regional and global RV systolic function are normal. 3. The left atrium is moderately enlarged. The right atrium is mildly enlarged. 4. The aortic valve is trileaflet in morphology. There is no significant aortic valve stenosis or regurgitation.  5. There is chordal SAM (systolic anterior motion of the mitral valve). There is flow turbulence in the LVOT indicative of a dynamic LVOT gradient. The peak velocity is at least 2.4 m/sec (peak gradient 24 mm Hg at rest). There is associated mild mitral valve regurgitation. 6. Delayed enhancement imaging for viability is abnormal.  a. There is patchy, mid-myocardial hyperenhancement involving the mid inferoseptum. This pattern of hyperenhancement is observed in a  patients with hypertrophic cardiomypathy. b. There is subendocardial hyperenhancement involving the mid-distal anterior wall, the distal septum, distal inferior wall, distal lateral wall, and apex, consistent with subendocardial myocardial infarction in the distribution of the LAD. 7. Adenosine stress perfusion imaging is abnormal. There are perfusion defects involving the basal-mid anterior wall, distal septum, and distal inferior wall, most consistent with inducible myocardial ischemia and a small amount of infarction. LGE 5.4% 8. There is no evidence of an intracardiac thrombus. 9. There is a small pericardial effusion adjacent to the inferior wall of the LV and the distal RV free wall. The pericardium is normal in thickness.  Cardiac cath on 01/28/20 ([facility]) LHC/RHC 01/28/20: Coronary arteries Dominance: right All coronaries normal (no CAD).  State: Baseline RA: 5 mmHg (mean) RV: 35/ 4 mmHg PA: 36/ 13  22 mmHg (mean) PCW: 11 mmHg (mean) AV O2: 5.5 vol% Cardiac output: 4.7 L/min Cardiac index: 2.3 L/min-m2 PVR: 2.3 Wood units  Assessment and Plan:  1.  Suspected HCM (extreme hypertrophy, presyncope, and NSVT) s/p [employer] dual chamber ICD  euvolemic today Stable on an appropriate medical regimen Normal ICD function See Pace Art report No changes today  2. HTN Continue coreg and amlodipine.      3. NSVT Chronic. No symptoms.  Continue beta blocker.   Current medicines are reviewed at length with the patient today.   The patient {ACTIONS; HAS/DOES NOT HAVE:19233} concerns regarding his medicines.  The following changes were made today:  {NONE DEFAULTED:18576::"none"}  Labs/ tests ordered today include: *** No orders of the defined types were placed in this encounter.    Disposition:   Follow up with {Blank single:19197::"Dr. Allred","Dr. Amada Jupiter. Klein","Dr. Camnitz","Dr. Lambert","EP APP"}  {gen number [id number]:[id number]} {TIME; UNITS DAY/WEEK/MONTH:19136}   Signed, Graciella Freer, PA-C  08/21/2020 9:15 AM  [facility] [address] [place] [address] [phone] (office) [phone] (fax)

## 2020-08-22 ENCOUNTER — Encounter: Payer: Self-pay | Admitting: Student

## 2020-10-17 ENCOUNTER — Other Ambulatory Visit: Payer: Self-pay

## 2020-10-17 ENCOUNTER — Emergency Department (HOSPITAL_COMMUNITY)
Admission: EM | Admit: 2020-10-17 | Discharge: 2020-10-18 | Disposition: A | Discharge status: Home / Self Care | Payer: Self-pay | Attending: Emergency Medicine | Admitting: Emergency Medicine

## 2020-10-17 ENCOUNTER — Encounter (HOSPITAL_COMMUNITY): Payer: Self-pay | Admitting: Emergency Medicine

## 2020-10-17 DIAGNOSIS — R52 Pain, unspecified: Secondary | ICD-10-CM

## 2020-10-17 DIAGNOSIS — R110 Nausea: Secondary | ICD-10-CM | POA: Insufficient documentation

## 2020-10-17 DIAGNOSIS — J3489 Other specified disorders of nose and nasal sinuses: Secondary | ICD-10-CM | POA: Insufficient documentation

## 2020-10-17 DIAGNOSIS — I110 Hypertensive heart disease with heart failure: Secondary | ICD-10-CM | POA: Insufficient documentation

## 2020-10-17 DIAGNOSIS — E119 Type 2 diabetes mellitus without complications: Secondary | ICD-10-CM | POA: Insufficient documentation

## 2020-10-17 DIAGNOSIS — Z7982 Long term (current) use of aspirin: Secondary | ICD-10-CM | POA: Insufficient documentation

## 2020-10-17 DIAGNOSIS — F17200 Nicotine dependence, unspecified, uncomplicated: Secondary | ICD-10-CM | POA: Insufficient documentation

## 2020-10-17 DIAGNOSIS — R197 Diarrhea, unspecified: Secondary | ICD-10-CM | POA: Insufficient documentation

## 2020-10-17 DIAGNOSIS — M7918 Myalgia, other site: Secondary | ICD-10-CM | POA: Insufficient documentation

## 2020-10-17 DIAGNOSIS — I509 Heart failure, unspecified: Secondary | ICD-10-CM | POA: Insufficient documentation

## 2020-10-17 DIAGNOSIS — Z20822 Contact with and (suspected) exposure to covid-19: Secondary | ICD-10-CM | POA: Insufficient documentation

## 2020-10-17 DIAGNOSIS — Z79899 Other long term (current) drug therapy: Secondary | ICD-10-CM | POA: Insufficient documentation

## 2020-10-17 DIAGNOSIS — J449 Chronic obstructive pulmonary disease, unspecified: Secondary | ICD-10-CM | POA: Insufficient documentation

## 2020-10-17 MED ORDER — ONDANSETRON 4 MG PO TBDP: 4.0000 mg | ORAL_TABLET | Freq: Once | ORAL | Status: DC | PRN

## 2020-10-17 NOTE — ED Triage Notes (Signed)
Pt reports nausea and upset stomach since Saturday.  Other symptoms include chills, weakness diarrhia and generally feeling poorly.

## 2020-10-18 DIAGNOSIS — R112 Nausea with vomiting, unspecified: Secondary | ICD-10-CM | POA: Diagnosis not present

## 2020-10-18 DIAGNOSIS — Z20822 Contact with and (suspected) exposure to covid-19: Secondary | ICD-10-CM | POA: Diagnosis not present

## 2020-10-18 DIAGNOSIS — M791 Myalgia, unspecified site: Secondary | ICD-10-CM | POA: Diagnosis not present

## 2020-10-18 LAB — CBC
HCT: 40.1 % (ref 39.0–52.0)
Hemoglobin: 13.2 g/dL (ref 13.0–17.0)
MCH: 31.9 pg (ref 26.0–34.0)
MCHC: 32.9 g/dL (ref 30.0–36.0)
MCV: 96.9 fL (ref 80.0–100.0)
Platelets: 190 10*3/uL (ref 150–400)
RBC: 4.14 MIL/uL — ABNORMAL LOW (ref 4.22–5.81)
RDW: 12.4 % (ref 11.5–15.5)
WBC: 4.9 10*3/uL (ref 4.0–10.5)
nRBC: 0 % (ref 0.0–0.2)

## 2020-10-18 LAB — URINALYSIS, ROUTINE W REFLEX MICROSCOPIC
Bacteria, UA: NONE SEEN
Bilirubin Urine: NEGATIVE
Glucose, UA: NEGATIVE mg/dL
Hgb urine dipstick: NEGATIVE
Ketones, ur: 5 mg/dL — AB
Leukocytes,Ua: NEGATIVE
Nitrite: NEGATIVE
Protein, ur: 100 mg/dL — AB
Specific Gravity, Urine: 1.024 (ref 1.005–1.030)
pH: 5 (ref 5.0–8.0)

## 2020-10-18 LAB — RESPIRATORY PANEL BY RT PCR (FLU A&B, COVID)
Influenza A by PCR: NEGATIVE
Influenza B by PCR: NEGATIVE
SARS Coronavirus 2 by RT PCR: NEGATIVE

## 2020-10-18 LAB — COMPREHENSIVE METABOLIC PANEL
ALT: 13 U/L (ref 0–44)
AST: 18 U/L (ref 15–41)
Albumin: 4 g/dL (ref 3.5–5.0)
Alkaline Phosphatase: 61 U/L (ref 38–126)
Anion gap: 11 (ref 5–15)
BUN: 20 mg/dL (ref 6–20)
CO2: 22 mmol/L (ref 22–32)
Calcium: 9.2 mg/dL (ref 8.9–10.3)
Chloride: 101 mmol/L (ref 98–111)
Creatinine, Ser: 1.38 mg/dL — ABNORMAL HIGH (ref 0.61–1.24)
GFR, Estimated: 58 mL/min — ABNORMAL LOW (ref >60)
Glucose, Bld: 162 mg/dL — ABNORMAL HIGH (ref 70–99)
Potassium: 3.5 mmol/L (ref 3.5–5.1)
Sodium: 134 mmol/L — ABNORMAL LOW (ref 135–145)
Total Bilirubin: 0.7 mg/dL (ref 0.3–1.2)
Total Protein: 7.4 g/dL (ref 6.5–8.1)

## 2020-10-18 LAB — LIPASE, BLOOD: Lipase: 25 U/L (ref 11–51)

## 2020-10-18 MED ORDER — ONDANSETRON 4 MG PO TBDP
4.0000 mg | ORAL_TABLET | Freq: Once | ORAL | Status: AC
  Administered 2020-10-18: 4 mg via ORAL
  Filled 2020-10-18: qty 1

## 2020-10-18 MED ORDER — ONDANSETRON 4 MG PO TBDP
4.0000 mg | ORAL_TABLET | Freq: Three times a day (TID) | ORAL | 0 refills | Status: DC | PRN
Start: 2020-10-18 — End: 2021-08-07

## 2020-10-18 NOTE — ED Notes (Signed)
Patient verbalized understanding of dc instructions, vss, ambulatory with nad.   

## 2020-10-18 NOTE — ED Provider Notes (Signed)
[facility] EMERGENCY DEPARTMENT Provider Note   CSN: [id number] Arrival date & time: 10/17/20  2338     History Chief Complaint  Patient presents with  . Nausea  . Back Pain    Johnny Schroeder is a 54 y.o. male.  Patient presents to the ED with a chief complaint of nausea and diarrhea.  He states that the symptoms started on Saturday (3 days ago).  He states that he has also had subjective fevers and chills.  He reports generalized body aches.  He has not tried taking anything for his symptoms.  He is vaccinated against COVID.  He is uncertain if he has had any exposures.  The history is provided by the patient. No language interpreter was used.       Past Medical History:  Diagnosis Date  . CHF (congestive heart failure) (HCC)   . COPD (chronic obstructive pulmonary disease) (HCC)   . Diabetes mellitus without complication (HCC)   . Hypertension     Patient Active Problem List   Diagnosis Date Noted  . Essential hypertension 10/26/2019    History reviewed. No pertinent surgical history.     No family history on file.  Social History   Tobacco Use  . Smoking status: Current Every Day Smoker    Packs/day: 1.00  . Smokeless tobacco: Never Used  Vaping Use  . Vaping Use: Never used  Substance Use Topics  . Alcohol use: No  . Drug use: Yes    Types: Marijuana    Home Medications Prior to Admission medications   Medication Sig Start Date End Date Taking? Authorizing Provider  acetaminophen (TYLENOL) 500 MG tablet Take 2 tablets (1,000 mg total) by mouth every 6 (six) hours as needed. 08/04/20   [name], MD  amLODipine (NORVASC) 10 MG tablet Take 1 tablet (10 mg total) by mouth daily. 06/08/20   Sheilah Pigeon, PA-C  aspirin EC 81 MG tablet Take 81 mg by mouth daily.    [provider]  atorvastatin (LIPITOR) 40 MG tablet Take 40 mg by mouth daily. 03/03/20 03/03/21  [provider]  carvedilol (COREG) 12.5 MG tablet  Take 3 tablets (37.5 mg total) by mouth 2 (two) times daily. 06/08/20 06/08/21  Sheilah Pigeon, PA-C  cyclobenzaprine (FLEXERIL) 10 MG tablet Take 1 tablet (10 mg total) by mouth 2 (two) times daily as needed for muscle spasms. 08/04/20   [name], MD  emtricitabine-tenofovir ([name]) 200-300 MG tablet Take 1 tablet by mouth daily. 06/02/20   [provider]  hydrochlorothiazide (HYDRODIURIL) 25 MG tablet Take 25 mg by mouth daily. 03/03/20   [provider]  isosorbide mononitrate (IMDUR) 60 MG 24 hr tablet Take 60 mg by mouth daily. 03/03/20 03/03/21  [provider]  losartan (COZAAR) 50 MG tablet Take 50 mg by mouth daily. 03/03/20 03/03/21  [provider]  methylPREDNISolone (MEDROL DOSEPAK) 4 MG TBPK tablet Per dose pack instruction 08/04/20   [name], MD  ondansetron (ZOFRAN ODT) 4 MG disintegrating tablet Take 1 tablet (4 mg total) by mouth every 8 (eight) hours as needed for nausea or vomiting. 10/18/20   Roxy Horseman, PA-C    Allergies    Lactose and Lisinopril  Review of Systems   Review of Systems  All other systems reviewed and are negative.   Physical Exam Updated Vital Signs BP 124/70   Pulse 79   Temp 98.7 F (37.1 C) (Oral)   Resp 18   SpO2 96%  Physical Exam Vitals and nursing note reviewed.  Constitutional:      Appearance: He is well-developed.  HENT:     Head: Normocephalic and atraumatic.  Eyes:     Conjunctiva/sclera: Conjunctivae normal.  Cardiovascular:     Rate and Rhythm: Normal rate and regular rhythm.     Heart sounds: No murmur heard.   Pulmonary:     Effort: Pulmonary effort is normal. No respiratory distress.     Breath sounds: Normal breath sounds.  Abdominal:     Palpations: Abdomen is soft.     Tenderness: There is no abdominal tenderness.  Musculoskeletal:        General: Normal range of motion.     Cervical back: Neck supple.  Skin:    General: Skin is warm and dry.  Neurological:      Mental Status: He is alert and oriented to person, place, and time.  Psychiatric:        Mood and Affect: Mood normal.        Behavior: Behavior normal.     ED Results / Procedures / Treatments   Labs (all labs ordered are listed, but only abnormal results are displayed) Labs Reviewed  COMPREHENSIVE METABOLIC PANEL - Abnormal; Notable for the following components:      Result Value   Sodium 134 (*)    Glucose, Bld 162 (*)    Creatinine, Ser 1.38 (*)    GFR, Estimated 58 (*)    All other components within normal limits  CBC - Abnormal; Notable for the following components:   RBC 4.14 (*)    All other components within normal limits  URINALYSIS, ROUTINE W REFLEX MICROSCOPIC - Abnormal; Notable for the following components:   Ketones, ur 5 (*)    Protein, ur 100 (*)    All other components within normal limits  RESPIRATORY PANEL BY RT PCR (FLU A&B, COVID)  LIPASE, BLOOD    EKG None  Radiology No results found.  Procedures Procedures (including critical care time)  Medications Ordered in ED Medications  ondansetron (ZOFRAN-ODT) disintegrating tablet 4 mg (has no administration in time range)  ondansetron (ZOFRAN-ODT) disintegrating tablet 4 mg (4 mg Oral Given 10/18/20 1610)    ED Course  I have reviewed the triage vital signs and the nursing notes.  Pertinent labs & imaging results that were available during my care of the patient were reviewed by me and considered in my medical decision making (see chart for details).    MDM Rules/Calculators/A&P                          Patient here with generalized body aches, nausea, and diarrhea for the past few days.  Vital signs are stable.  Laboratory work-up is reassuring.  Abdomen is soft nontender.  Patient's nontoxic in appearance.  Do not feel that any advanced imaging is indicated.  Will check Covid.  Will prescribe Zofran for home.  Return precautions discussed.  Patient understands agrees with plan.  He is stable for  discharge.    Final Clinical Impression(s) / ED Diagnoses Final diagnoses:  Nausea  Body aches  Rhinorrhea    Rx / DC Orders ED Discharge Orders         Ordered    ondansetron (ZOFRAN ODT) 4 MG disintegrating tablet  Every 8 hours PRN        10/18/20 0600           Roxy Horseman, PA-C  10/18/20 2952    Palumbo, April, MD 10/18/20 8413

## 2020-10-18 NOTE — Discharge Instructions (Signed)
We will call you if your COVID test is positive.  You should stay out of work until your test results.  If your test is positive, you should quarantine for 10 days.  Return for new or worsening symptoms.

## 2020-10-18 NOTE — ED Notes (Signed)
ED Provider at bedside. 

## 2020-11-06 ENCOUNTER — Ambulatory Visit (INDEPENDENT_AMBULATORY_CARE_PROVIDER_SITE_OTHER): Payer: Self-pay

## 2020-11-06 DIAGNOSIS — Z95810 Presence of automatic (implantable) cardiac defibrillator: Secondary | ICD-10-CM

## 2020-11-07 LAB — CUP PACEART REMOTE DEVICE CHECK
Battery Remaining Longevity: 144 mo
Battery Remaining Percentage: 100 %
Brady Statistic RA Percent Paced: 8 %
Brady Statistic RV Percent Paced: 0 %
Date Time Interrogation Session: 20211108021100
HighPow Impedance: 49 Ohm
Implantable Lead Implant Date: 20210201
Implantable Lead Implant Date: 20210201
Implantable Lead Location: 753862
Implantable Lead Location: 753862
Implantable Lead Model: 273
Implantable Lead Model: 7841
Implantable Lead Serial Number: 1044519
Implantable Lead Serial Number: 109249
Implantable Pulse Generator Implant Date: 20210201
Lead Channel Impedance Value: 409 Ohm
Lead Channel Impedance Value: 680 Ohm
Lead Channel Pacing Threshold Amplitude: 0.4 V
Lead Channel Pacing Threshold Amplitude: 0.8 V
Lead Channel Pacing Threshold Pulse Width: 0.4 ms
Lead Channel Pacing Threshold Pulse Width: 0.4 ms
Lead Channel Setting Pacing Amplitude: 2 V
Lead Channel Setting Pacing Amplitude: 2 V
Lead Channel Setting Pacing Pulse Width: 0.4 ms
Lead Channel Setting Sensing Sensitivity: 0.6 mV
Pulse Gen Serial Number: 593625

## 2020-11-07 NOTE — Progress Notes (Signed)
Remote ICD transmission.   

## 2021-01-29 ENCOUNTER — Telehealth: Payer: Self-pay | Admitting: Student

## 2021-01-29 NOTE — Telephone Encounter (Signed)
Alert for > RV pacing, increased heartlogic score, and NSVT.   Called to assess symptoms and schedule overdue follow up.  No answer at number listed and mailbox full.   Left HIPPA compliant message on Emergency Contact number to have him call back our office to schedule appointment.   Casimiro Needle [address] [place], PA-C  01/29/2021 11:48 AM

## 2021-01-31 NOTE — Telephone Encounter (Signed)
Spoke with pt.  He denies any cardiac symptoms lately.  Although he says he is currently battling cold symptoms and due to insurance has not had his medication recently.  Pt is coming in for appt with Francis Dowse, PA on 02/09/21.  Pt stated that he is going to make it for the appt.

## 2021-02-05 ENCOUNTER — Ambulatory Visit (INDEPENDENT_AMBULATORY_CARE_PROVIDER_SITE_OTHER): Payer: 59

## 2021-02-05 DIAGNOSIS — I5032 Chronic diastolic (congestive) heart failure: Secondary | ICD-10-CM

## 2021-02-05 DIAGNOSIS — I422 Other hypertrophic cardiomyopathy: Secondary | ICD-10-CM

## 2021-02-05 NOTE — Progress Notes (Deleted)
Cardiology Office Note Date:  02/05/2021  Patient ID:  Johnny Schroeder, DOB [birth year], MRN [id number] PCP:  Patient, No Pcp Per  Cardiologist:  Malvin Johns Dr. Wyline Mood Oct 2020 hospital consultation  >> Dr. Jettie Pagan EP: Dr. Graciela Husbands (new today)    Chief Complaint:  *** device clinic alert  History of Present Illness: Johnny Schroeder is a 55 y.o. male with history of COPD, HTN , DM2, RBBB  He has had issus with poor BP control, it seems associated with medication noncompliance, (difficult work schedules, personal stressors).  He saw J. Cleaver, NP Dec 2020 had medication adjustment with plans to f/u with HTN clinic.    He was following with Dr. Regino Schultze at [facility], he last saw him 02/14/20, note reports  I reviewed his transthoracic echocardiogram and cardiac MRI. Although he has had longstanding refractory hypertension, the degree of his left ventricular hypertrophy as well as mild asymmetry suggest possible hypertrophic cardiomyopathy in addition to hypertensive heart disease.  He does not have significant outflow tract obstruction but rather appears to have cavity obliteration. No indication for septal reduction therapy at this time.  He now has primary prevention ICD implanted and will have follow-up by EP I have advised EKG and echocardiographic screening of his sister. He has no children.  2) hypertensive heart disease Blood pressures well controlled on current medical regimen. Although he is on several medications that would not typically be used in the setting of hypertrophic cardiomyopathy including amlodipine and diuretic therapy, he has had very refractory and elevated high blood pressure so will not change any medications at this time. I have advised obtaining a blood pressure monitor for checking his blood pressure at home.  He had hospitalization at [facility] in January 2021 for hypertensive urgency blood pressure 219/107 mmHg associated with chest pain and abnormal troponin  delta level. Transthoracic echocardiogram showed similar findings with severe concentric left ventricular hypertrophy without significant outflow tract gradient or mitral regurgitation. He underwent cardiac MRI 01/27/20 showing 1. The left ventricular cavity size is lower limits of normal to small in cavity size. There is severe concentric LVH (max wall thickness = 3.4 cm at the mid inferoseptum). There is cavity obliteration during systole. The LVEF is calculated at 71%.  2. The right ventricle is lower limits of normal to small in size. There is RV apical hypertrophy.  Regional and global RV systolic function are normal.  3. The left atrium is moderately enlarged. The right atrium is mildly enlarged.  4. The aortic valve is trileaflet in morphology. There is no significant aortic valve stenosis or regurgitation.   5. There is chordal SAM (systolic anterior motion of the mitral valve). There is flow turbulence in the LVOT indicative of a dynamic LVOT gradient. The peak velocity is at least 2.4 m/sec (peak gradient 24 mm Hg at rest). There is associated mild mitral valve regurgitation.  6. Delayed enhancement imaging for viability is abnormal.  a. There is patchy, mid-myocardial hyperenhancement involving the mid inferoseptum. This pattern of hyperenhancement is observed in a patients with hypertrophic cardiomypathy. b. There is subendocardial hyperenhancement involving the mid-distal anterior wall, the distal septum, distal inferior wall, distal lateral wall, and apex, consistent with subendocardial myocardial infarction in the distribution of the LAD.  7. Adenosine stress perfusion imaging is abnormal. There are perfusion defects involving the basal-mid anterior wall, distal septum, and distal inferior wall, most consistent with inducible myocardial ischemia and a small amount of infarction. LGE 5.4%  8. There is  no evidence of an intracardiac thrombus.  9. There is a small  pericardial effusion adjacent to the inferior wall of the LV and the distal RV free wall. The pericardium is normal in thickness.  Cardiac cath on 01/28/20 showed LHC/RHC 01/28/20: Coronary arteries Dominance: right All coronaries normal (no CAD).  State: Baseline RA: 5 mmHg (mean) RV: 35/ 4 mmHg PA: 36/ 13 22 mmHg (mean) PCW: 11 mmHg (mean) AV O2: 5.5 vol% Cardiac output: 4.7 L/min Cardiac index: 2.3 L/min-m2 PVR: 2.3 Wood units  He had runs of NSVT on telemetry. EP was consulted and primary prevention ICD was implanted prior to discharge for possible HCM with extreme hypertrophy, presyncope and NSVT. Several adjustments to his antihypertensive medications were made including addition of carvedilol, amlodipine and increasing dosage of losartan.   05/08/2020 he was seen by myself and Dr. Graciela Husbands to establish EP care/device management He comes in today to be seen to establish with EP/device clinic follow up, this is much more convenient for him. He has not until now had his device checked since the hospital/implant  He has felt well.  Infrequently feels a little flutter in his heart beat, no CP, no SOB, no near syncope or syncope. He denies dizzy spells.  No shocks He starts his new job tomorrow and says his financial situation will be much better and he will be better able to get his meds, he is currently withouht his amlodipine and coreg. He denies any exertional incapacities.  He sleeps well, snores, but denies symptoms of orthopnea or PND.  Device and site were OK> Discussed importance of hie medicines Planned to see him back once on his medicines Encouraged to keep Dr. Regino Schultze for his HCM foloow up and management. Recommended sleep study Given community center for primary care  I saw him June 2021 He feels well.  Unfortunately he was unable to fill his coreg and amldoipine, though today we made sure he had refills available to him and he will get started. No CP, palpitations,  no rest SOB, denies DOE' No dizziness, near syncope or syncope. He asks about exercise, would like to gain some muscle mass BP was high, he was going to start his medicines discussed avoiding vigorous exercise and weight training Was going to see him him back ON his medicines.    Cancelled last 2 appointments  Phone note dated 01/31/21 prompted by increased heart logic score, NSVT,  and increased RV pacing, device clinic called, mentioned he was without his medicines, reported no cardiac symptoms, did report cold-like symptoms.   *** no medicines again? *** volume *** labs, lytes *** insurance??  Community center? *** noncompliance is a issue!!    Device information BSci dual chamber ICD implanted 01/31/20, ([facility]), primary prevention   Past Medical History:  Diagnosis Date  . CHF (congestive heart failure) (HCC)   . COPD (chronic obstructive pulmonary disease) (HCC)   . Diabetes mellitus without complication (HCC)   . Hypertension     No past surgical history on file.  Current Outpatient Medications  Medication Sig Dispense Refill  . acetaminophen (TYLENOL) 500 MG tablet Take 2 tablets (1,000 mg total) by mouth every 6 (six) hours as needed. 30 tablet 0  . amLODipine (NORVASC) 10 MG tablet Take 1 tablet (10 mg total) by mouth daily. 30 tablet 11  . aspirin EC 81 MG tablet Take 81 mg by mouth daily.    Marland Kitchen atorvastatin (LIPITOR) 40 MG tablet Take 40 mg by mouth daily.    Marland Kitchen  carvedilol (COREG) 12.5 MG tablet Take 3 tablets (37.5 mg total) by mouth 2 (two) times daily. 180 tablet 11  . cyclobenzaprine (FLEXERIL) 10 MG tablet Take 1 tablet (10 mg total) by mouth 2 (two) times daily as needed for muscle spasms. 20 tablet 0  . emtricitabine-tenofovir ([name]) 200-300 MG tablet Take 1 tablet by mouth daily.    . hydrochlorothiazide (HYDRODIURIL) 25 MG tablet Take 25 mg by mouth daily.    . isosorbide mononitrate (IMDUR) 60 MG 24 hr tablet Take 60 mg by mouth daily.    Marland Kitchen losartan  (COZAAR) 50 MG tablet Take 50 mg by mouth daily.    . methylPREDNISolone (MEDROL DOSEPAK) 4 MG TBPK tablet Per dose pack instruction 21 tablet 0  . ondansetron (ZOFRAN ODT) 4 MG disintegrating tablet Take 1 tablet (4 mg total) by mouth every 8 (eight) hours as needed for nausea or vomiting. 10 tablet 0   No current facility-administered medications for this visit.    Allergies:   Lactose and Lisinopril   Social History:  The patient  reports that he has been smoking. He has been smoking about 1.00 pack per day. He has never used smokeless tobacco. He reports current drug use. Drug: Marijuana. He reports that he does not drink alcohol.   Family History:  . Diabetes type II Mother  . Glaucoma Mother  . High blood pressure (Hypertension) Mother  . Stroke Maternal Grandmother    ROS:  Please see the history of present illness.  All other systems are reviewed and otherwise negative.   PHYSICAL EXAM:  VS:  There were no vitals taken for this visit. BMI: There is no height or weight on file to calculate BMI. Well nourished, well developed, in no acute distress  HEENT: normocephalic, atraumatic  Neck: no JVD, carotid bruits or masses Cardiac: ***  RRR; 2-3/6SM, no rubs, or gallops Lungs: ***  CTA b/l, no wheezing, rhonchi or rales  Abd: soft, nontender MS: no deformity or *** atrophy Ext: *** no edema  Skin: warm and dry, no rash Neuro:  No gross deficits appreciated Psych: euthymic mood, full affect  *** ICD site: is stable, well healed, no erythema, no tethering or skin changes   EKG:  Not done today  ICD interrogation done today and reviewed by myself:  ***  12/13/2019: TTE IMPRESSIONS  1. Left ventricular ejection fraction, by visual estimation, is >75%. The  left ventricle has hyperdynamic function. There is severely increased left  ventricular hypertrophy.  2. Left ventricular diastolic parameters are consistent with Grade I  diastolic dysfunction (impaired  relaxation).  3. The left ventricle has no regional wall motion abnormalities.  4. Global right ventricle has normal systolic function.The right  ventricular size is normal. No increase in right ventricular wall  thickness.  5. Left atrial size was normal.  6. Right atrial size was normal.  7. The mitral valve is normal in structure. Trivial mitral valve  regurgitation.  8. The tricuspid valve is normal in structure. Tricuspid valve  regurgitation is not demonstrated.  9. The aortic valve is normal in structure. Aortic valve regurgitation is  not visualized.  10. The pulmonic valve was normal in structure. Pulmonic valve  regurgitation is not visualized.  11. The atrial septum is grossly normal.   Recent Labs: 10/17/2020: ALT 13; BUN 20; Creatinine, Ser 1.38; Hemoglobin 13.2; Platelets 190; Potassium 3.5; Sodium 134  No results found for requested labs within last 8760 hours.   CrCl cannot be calculated (Patient's  most recent lab result is older than the maximum 21 days allowed.).   Wt Readings from Last 3 Encounters:  08/08/20 186 lb 11.2 oz (84.7 kg)  06/08/20 182 lb 12.8 oz (82.9 kg)  05/08/20 174 lb (78.9 kg)     Other studies reviewed: Additional studies/records reviewed today include: summarized above  ASSESSMENT AND PLAN:  1. ICD     *** Intact function  2. HCM 3. Chronic CHF (diastolic)    ***  No symptoms or exam findings to suggest volume OL     ***g in sweets and increased intake     *** His heart logic score is up though footprint looks good     No changes for now   4. HTN     ***     5. NSVT     Not new, no symptoms     ***      Disposition: ***    Current medicines are reviewed at length with the patient today.  The patient did not have any concerns regarding medicines.  Norma Fredrickson, PA-C 02/05/2021 11:41 AM     [facility] [address] [place] [address] [phone] (office)  [phone]  (fax)  Seen and examined Plan reviewed and importance of medications stressed Device/shock plan reviewed Will need sleep study

## 2021-02-07 LAB — CUP PACEART REMOTE DEVICE CHECK
Battery Remaining Longevity: 132 mo
Battery Remaining Percentage: 100 %
Brady Statistic RA Percent Paced: 8 %
Brady Statistic RV Percent Paced: 2 %
Date Time Interrogation Session: 20220207021100
HighPow Impedance: 49 Ohm
Implantable Lead Implant Date: 20210201
Implantable Lead Implant Date: 20210201
Implantable Lead Location: 753862
Implantable Lead Location: 753862
Implantable Lead Model: 273
Implantable Lead Model: 7841
Implantable Lead Serial Number: 1044519
Implantable Lead Serial Number: 109249
Implantable Pulse Generator Implant Date: 20210201
Lead Channel Impedance Value: 398 Ohm
Lead Channel Impedance Value: 667 Ohm
Lead Channel Pacing Threshold Amplitude: 0.4 V
Lead Channel Pacing Threshold Amplitude: 0.8 V
Lead Channel Pacing Threshold Pulse Width: 0.4 ms
Lead Channel Pacing Threshold Pulse Width: 0.4 ms
Lead Channel Setting Pacing Amplitude: 2 V
Lead Channel Setting Pacing Amplitude: 2 V
Lead Channel Setting Pacing Pulse Width: 0.4 ms
Lead Channel Setting Sensing Sensitivity: 0.6 mV
Pulse Gen Serial Number: 593625

## 2021-02-08 ENCOUNTER — Encounter: Payer: Self-pay | Admitting: Physician Assistant

## 2021-02-08 ENCOUNTER — Telehealth: Payer: Self-pay

## 2021-02-08 NOTE — Telephone Encounter (Signed)
Latitude alert received for 1 VT arrhythmia that into monitor only zone, no therapy delivered.  No EGM available. Patient reports during this time he was asleep and not aware. States he is out of several of his medications including coreg and Imdur, d/t changing jobs and insurance reasons. Patient is aware of follow up apt. tomorrow and states he will be here for it. Advised to call with further questions or concerns.

## 2021-02-09 NOTE — Progress Notes (Signed)
Remote ICD transmission.   

## 2021-03-04 NOTE — Progress Notes (Deleted)
Cardiology Office Note Date:  03/04/2021  Patient ID:  Johnny Schroeder, DOB Mar 17, 1966, MRN [id number] PCP:  Patient, No Pcp Per  Cardiologist:  Malvin JohnsSaw Dr. Wyline MoodBranch Oct 2020 hospital consultation  >> Dr. Jettie PaganWang [facility] EP: Dr. Graciela HusbandsKlein (new today)    Chief Complaint:  *** device clinic alert  History of Present Illness: Johnny Schroeder is a 55 y.o. male with history of COPD, HTN , DM2, RBBB  He has had issus with poor BP control, it seems associated with medication noncompliance, (difficult work schedules, personal stressors).  He saw J. Cleaver, NP Dec 2020 had medication adjustment with plans to f/u with HTN clinic.    He was [facility], he last saw him 02/14/20, note reports  I reviewed his transthoracic echocardiogram and cardiac MRI. Although he has had longstanding refractory hypertension, the degree of his left ventricular hypertrophy as well as mild asymmetry suggest possible hypertrophic cardiomyopathy in addition to hypertensive heart disease.  He does not have significant outflow tract obstruction but rather appears to have cavity obliteration. No indication for septal reduction therapy at this time.  He now has primary prevention ICD implanted and will have follow-up by EP I have advised EKG and echocardiographic screening of his sister. He has no children.  2) hypertensive heart disease Blood pressures well controlled on current medical regimen. Although he is on several medications that would not typically be used in the setting of hypertrophic cardiomyopathy including amlodipine and diuretic therapy, he has had very refractory and elevated high blood pressure so will not change any medications at this time. I have advised obtaining a blood pressure monitor for checking his blood pressure at home.  He had [facility] at [facility] in January 2021 for hypertensive urgency blood pressure 219/107 mmHg associated with chest pain and abnormal troponin  delta level. Transthoracic echocardiogram showed similar findings with severe concentric left ventricular hypertrophy without significant outflow tract gradient or mitral regurgitation. He underwent cardiac MRI 01/27/20 showing 1. The left ventricular cavity size is lower limits of normal to small in cavity size. There is severe concentric LVH (max wall thickness = 3.4 cm at the mid inferoseptum). There is cavity obliteration during systole. The LVEF is calculated at 71%.  2. The right ventricle is lower limits of normal to small in size. There is RV apical hypertrophy.  Regional and global RV systolic function are normal.  3. The left atrium is moderately enlarged. The right atrium is mildly enlarged.  4. The aortic valve is trileaflet in morphology. There is no significant aortic valve stenosis or regurgitation.   5. There is chordal SAM (systolic anterior motion of the mitral valve). There is flow turbulence in the LVOT indicative of a dynamic LVOT gradient. The peak velocity is at least 2.4 m/sec (peak gradient 24 mm Hg at rest). There is associated mild mitral valve regurgitation.  6. Delayed enhancement imaging for viability is abnormal.  a. There is patchy, mid-myocardial hyperenhancement involving the mid inferoseptum. This pattern of hyperenhancement is observed in a patients with hypertrophic cardiomypathy. b. There is subendocardial hyperenhancement involving the mid-distal anterior wall, the distal septum, distal inferior wall, distal lateral wall, and apex, consistent with subendocardial myocardial infarction in the distribution of the LAD.  7. Adenosine stress perfusion imaging is abnormal. There are perfusion defects involving the basal-mid anterior wall, distal septum, and distal inferior wall, most consistent with inducible myocardial ischemia and a small amount of infarction. LGE 5.4%  8. There is  no evidence of an intracardiac thrombus.  9. There is a small  pericardial effusion adjacent to the inferior wall of the LV and the distal RV free wall. The pericardium is normal in thickness.  Cardiac cath on 01/28/20 showed LHC/RHC 01/28/20: Coronary arteries Dominance: right All coronaries normal (no CAD).  State: Baseline RA: 5 mmHg (mean) RV: 35/ 4 mmHg PA: 36/ 13 22 mmHg (mean) PCW: 11 mmHg (mean) AV O2: 5.5 vol% Cardiac output: 4.7 L/min Cardiac index: 2.3 L/min-m2 PVR: 2.3 Wood units  He had runs of NSVT on telemetry. EP was consulted and primary prevention ICD was implanted prior to discharge for possible HCM with extreme hypertrophy, presyncope and NSVT. Several adjustments to his antihypertensive medications were made including addition of carvedilol, amlodipine and increasing dosage of losartan.   05/08/2020 he was seen by myself and Dr. Graciela Husbands to establish EP care/device management He comes in today to be seen to establish with EP/device clinic follow up, this is much more convenient for him. He has not until now had his device checked since the hospital/implant  He has felt well.  Infrequently feels a little flutter in his heart beat, no CP, no SOB, no near syncope or syncope. He denies dizzy spells.  No shocks He starts his new job tomorrow and says his financial situation will be much better and he will be better able to get his meds, he is currently withouht his amlodipine and coreg. He denies any exertional incapacities.  He sleeps well, snores, but denies symptoms of orthopnea or PND.  Device and site were OK> Discussed importance of hie medicines Planned to see him back once on his medicines Encouraged to keep Dr. Regino Schultze for his HCM foloow up and management. Recommended sleep study Given community [facility] for [facility]  I saw him June 2021 He feels well.  Unfortunately he was unable to fill his coreg and amldoipine, though today we made sure he had refills available to him and he will get started. No CP, palpitations,  no rest SOB, denies DOE' No dizziness, near syncope or syncope. He asks about exercise, would like to gain some muscle mass BP was high, he was going to start his medicines discussed avoiding vigorous exercise and weight training Was going to see him him back ON his medicines.    Cancelled last 2 appointments  Phone note dated 01/31/21 prompted by increased heart logic score, NSVT,  and increased RV pacing, device clinic called, mentioned he was without his medicines, reported no cardiac symptoms, did report cold-like symptoms.   *** no medicines again? *** volume *** labs, lytes *** insurance??  Community center? *** noncompliance is a issue!!    Device information BSci dual chamber ICD implanted 01/31/20, ([facility]), primary prevention   Past Medical History:  Diagnosis Date  . CHF (congestive heart failure) (HCC)   . COPD (chronic obstructive pulmonary disease) (HCC)   . Diabetes mellitus without complication (HCC)   . Hypertension     No past surgical history on file.  Current Outpatient Medications  Medication Sig Dispense Refill  . acetaminophen (TYLENOL) 500 MG tablet Take 2 tablets (1,000 mg total) by mouth every 6 (six) hours as needed. 30 tablet 0  . amLODipine (NORVASC) 10 MG tablet Take 1 tablet (10 mg total) by mouth daily. 30 tablet 11  . aspirin EC 81 MG tablet Take 81 mg by mouth daily.    Marland Kitchen atorvastatin (LIPITOR) 40 MG tablet Take 40 mg by mouth daily.    Marland Kitchen  carvedilol (COREG) 12.5 MG tablet Take 3 tablets (37.5 mg total) by mouth 2 (two) times daily. 180 tablet 11  . cyclobenzaprine (FLEXERIL) 10 MG tablet Take 1 tablet (10 mg total) by mouth 2 (two) times daily as needed for muscle spasms. 20 tablet 0  . emtricitabine-tenofovir ([name]) 200-300 MG tablet Take 1 tablet by mouth daily.    . hydrochlorothiazide (HYDRODIURIL) 25 MG tablet Take 25 mg by mouth daily.    . isosorbide mononitrate (IMDUR) 60 MG 24 hr tablet Take 60 mg by mouth daily.    .  methylPREDNISolone (MEDROL DOSEPAK) 4 MG TBPK tablet Per dose pack instruction 21 tablet 0  . ondansetron (ZOFRAN ODT) 4 MG disintegrating tablet Take 1 tablet (4 mg total) by mouth every 8 (eight) hours as needed for nausea or vomiting. 10 tablet 0   No current facility-administered medications for this visit.    Allergies:   Lactose and Lisinopril   Social History:  The patient  reports that he has been smoking. He has been smoking about 1.00 pack per day. He has never used smokeless tobacco. He reports current drug use. Drug: Marijuana. He reports that he does not drink alcohol.   Family History:  . Diabetes type II Mother  . Glaucoma Mother  . High blood pressure (Hypertension) Mother  . Stroke Maternal Grandmother    ROS:  Please see the history of present illness.  All other systems are reviewed and otherwise negative.   PHYSICAL EXAM:  VS:  There were no vitals taken for this visit. BMI: There is no height or weight on file to calculate BMI. Well nourished, well developed, in no acute distress  HEENT: normocephalic, atraumatic  Neck: no JVD, carotid bruits or masses Cardiac: ***  RRR; 2-3/6SM, no rubs, or gallops Lungs: ***  CTA b/l, no wheezing, rhonchi or rales  Abd: soft, nontender MS: no deformity or *** atrophy Ext: *** no edema  Skin: warm and dry, no rash Neuro:  No gross deficits appreciated Psych: euthymic mood, full affect  *** ICD site: is stable, well healed, no erythema, no tethering or skin changes   EKG:  Not done today  ICD interrogation done today and reviewed by myself:  ***  12/13/2019: TTE IMPRESSIONS  1. Left ventricular ejection fraction, by visual estimation, is >75%. The  left ventricle has hyperdynamic function. There is severely increased left  ventricular hypertrophy.  2. Left ventricular diastolic parameters are consistent with Grade I  diastolic dysfunction (impaired relaxation).  3. The left ventricle has no regional wall motion  abnormalities.  4. Global right ventricle has normal systolic function.The right  ventricular size is normal. No increase in right ventricular wall  thickness.  5. Left atrial size was normal.  6. Right atrial size was normal.  7. The mitral valve is normal in structure. Trivial mitral valve  regurgitation.  8. The tricuspid valve is normal in structure. Tricuspid valve  regurgitation is not demonstrated.  9. The aortic valve is normal in structure. Aortic valve regurgitation is  not visualized.  10. The pulmonic valve was normal in structure. Pulmonic valve  regurgitation is not visualized.  11. The atrial septum is grossly normal.   Recent Labs: 10/17/2020: ALT 13; BUN 20; Creatinine, Ser 1.38; Hemoglobin 13.2; Platelets 190; Potassium 3.5; Sodium 134  No results found for requested labs within last 8760 hours.   CrCl cannot be calculated (Patient's most recent lab result is older than the maximum 21 days allowed.).   Wt  Readings from Last 3 Encounters:  08/08/20 186 lb 11.2 oz (84.7 kg)  06/08/20 182 lb 12.8 oz (82.9 kg)  05/08/20 174 lb (78.9 kg)     Other studies reviewed: Additional studies/records reviewed today include: summarized above  ASSESSMENT AND PLAN:  1. ICD     *** Intact function  2. HCM 3. Chronic CHF (diastolic)    ***  No symptoms or exam findings to suggest volume OL     ***g in sweets and increased intake     *** His heart logic score is up though footprint looks good     No changes for now   4. HTN     ***     5. NSVT     Not new, no symptoms     ***      Disposition: ***    Current medicines are reviewed at length with the patient today.  The patient did not have any concerns regarding medicines.  Norma Fredrickson, PA-C 03/04/2021 9:56 AM     [facility] [address] [place] [address] [phone] (office)  [phone] (fax)  Seen and examined Plan reviewed and importance of  medications stressed Device/shock plan reviewed Will need sleep study

## 2021-03-05 ENCOUNTER — Encounter: Payer: Self-pay | Admitting: Physician Assistant

## 2021-03-16 ENCOUNTER — Ambulatory Visit (INDEPENDENT_AMBULATORY_CARE_PROVIDER_SITE_OTHER): Payer: BC Managed Care – PPO | Admitting: Student

## 2021-03-16 ENCOUNTER — Other Ambulatory Visit: Payer: Self-pay

## 2021-03-16 ENCOUNTER — Encounter: Payer: Self-pay | Admitting: Student

## 2021-03-16 VITALS — BP 150/80 | HR 60 | Ht 73.0 in | Wt 176.0 lb

## 2021-03-16 DIAGNOSIS — I472 Ventricular tachycardia: Secondary | ICD-10-CM

## 2021-03-16 DIAGNOSIS — I5032 Chronic diastolic (congestive) heart failure: Secondary | ICD-10-CM | POA: Diagnosis not present

## 2021-03-16 DIAGNOSIS — Z95810 Presence of automatic (implantable) cardiac defibrillator: Secondary | ICD-10-CM

## 2021-03-16 DIAGNOSIS — I4729 Other ventricular tachycardia: Secondary | ICD-10-CM

## 2021-03-16 DIAGNOSIS — E119 Type 2 diabetes mellitus without complications: Secondary | ICD-10-CM

## 2021-03-16 DIAGNOSIS — I422 Other hypertrophic cardiomyopathy: Secondary | ICD-10-CM | POA: Diagnosis not present

## 2021-03-16 DIAGNOSIS — I10 Essential (primary) hypertension: Secondary | ICD-10-CM

## 2021-03-16 LAB — BASIC METABOLIC PANEL
BUN/Creatinine Ratio: 18 (ref 9–20)
BUN: 20 mg/dL (ref 6–24)
CO2: 20 mmol/L (ref 20–29)
Calcium: 9.3 mg/dL (ref 8.7–10.2)
Chloride: 103 mmol/L (ref 96–106)
Creatinine, Ser: 1.12 mg/dL (ref 0.76–1.27)
Glucose: 141 mg/dL — ABNORMAL HIGH (ref 65–99)
Potassium: 4.2 mmol/L (ref 3.5–5.2)
Sodium: 138 mmol/L (ref 134–144)
eGFR: 79 mL/min/{1.73_m2} (ref >59)

## 2021-03-16 LAB — CUP PACEART INCLINIC DEVICE CHECK
Brady Statistic RA Percent Paced: 17 %
Brady Statistic RV Percent Paced: 0 %
Date Time Interrogation Session: 20220318104746
HighPow Impedance: 52 Ohm
Implantable Lead Implant Date: 20210201
Implantable Lead Implant Date: 20210201
Implantable Lead Location: 753862
Implantable Lead Location: 753862
Implantable Lead Model: 273
Implantable Lead Model: 7841
Implantable Lead Serial Number: 1044519
Implantable Lead Serial Number: 109249
Implantable Pulse Generator Implant Date: 20210201
Lead Channel Impedance Value: 441 Ohm
Lead Channel Impedance Value: 675 Ohm
Lead Channel Pacing Threshold Amplitude: 0.6 V
Lead Channel Pacing Threshold Amplitude: 0.7 V
Lead Channel Pacing Threshold Pulse Width: 0.4 ms
Lead Channel Pacing Threshold Pulse Width: 0.4 ms
Lead Channel Sensing Intrinsic Amplitude: 17.9 mV
Lead Channel Sensing Intrinsic Amplitude: 5 mV
Lead Channel Setting Pacing Amplitude: 2 V
Lead Channel Setting Pacing Amplitude: 2 V
Lead Channel Setting Pacing Pulse Width: 0.4 ms
Lead Channel Setting Sensing Sensitivity: 0.6 mV
Pulse Gen Serial Number: 593625

## 2021-03-16 LAB — HEMOGLOBIN A1C
Est. average glucose Bld gHb Est-mCnc: 134 mg/dL
Hgb A1c MFr Bld: 6.3 % — ABNORMAL HIGH (ref 4.8–5.6)

## 2021-03-16 LAB — CBC
Hematocrit: 39.4 % (ref 37.5–51.0)
Hemoglobin: 13.3 g/dL (ref 13.0–17.7)
MCH: 32 pg (ref 26.6–33.0)
MCHC: 33.8 g/dL (ref 31.5–35.7)
MCV: 95 fL (ref 79–97)
Platelets: 222 10*3/uL (ref 150–450)
RBC: 4.15 x10E6/uL (ref 4.14–5.80)
RDW: 12.7 % (ref 11.6–15.4)
WBC: 3.9 10*3/uL (ref 3.4–10.8)

## 2021-03-16 MED ORDER — LOSARTAN POTASSIUM 50 MG PO TABS: 50.0000 mg | ORAL_TABLET | Freq: Every day | ORAL | 3 refills | Status: DC

## 2021-03-16 MED ORDER — ISOSORBIDE MONONITRATE ER 60 MG PO TB24: 60.0000 mg | ORAL_TABLET | Freq: Every day | ORAL | 3 refills | Status: AC

## 2021-03-16 NOTE — Patient Instructions (Signed)
Medication Instructions:  Your physician recommends that you continue on your current medications as directed. Please refer to the Current Medication list given to you today.  *If you need a refill on your cardiac medications before your next appointment, please call your pharmacy*   Lab Work: TODAY: BMET, CBC, A1C  If you have labs (blood work) drawn today and your tests are completely normal, you will receive your results only by: Marland Kitchen MyChart Message (if you have MyChart) OR . A paper copy in the mail  Follow-Up: At [facility], you and your health needs are our priority.  As part of our continuing mission to provide you with exceptional heart care, we have created designated Provider Care Teams.  These Care Teams include your primary Cardiologist (physician) and Advanced Practice Providers (APPs -  Physician Assistants and Nurse Practitioners) who all work together to provide you with the care you need, when you need it.  Your next appointment:   As scheduled with Otilio Saber, PA   Other Instructions You can call [facility] at [phone] and they will help you find a Primary Care Physician

## 2021-03-16 NOTE — Progress Notes (Signed)
Electrophysiology Office Note Date: 03/16/2021  ID:  Pax Reasoner, DOB 06/27/1966, MRN [id number]  PCP: Patient, No Pcp Per  Primary Cardiologist: Armanda Magic, MD Electrophysiologist: Sherryl Manges, MD   CC: Routine ICD follow-up  Johnny Schroeder is a 55 y.o. male with history of COPD, HTN , DM2, RBBB seen today for Sherryl Manges, MD for routine electrophysiology followup.  Since last being seen in our clinic the patient reports doing OK. He lost and then got back his insurance, so has gone for periods without his medication. He does not currently have medication for his diabetes. He does not currently take his medications every day. He works maintenance at [employer]. He denies chest pains. He does most of his work at [employer] without difficulty. He has not had ICD shocks.   Device History: BSci dual chamber ICD implanted 01/31/20, ([facility]), primary prevention  Past Medical History:  Diagnosis Date  . CHF (congestive heart failure) (HCC)   . COPD (chronic obstructive pulmonary disease) (HCC)   . Diabetes mellitus without complication (HCC)   . Hypertension    History reviewed. No pertinent surgical history.  Current Outpatient Medications  Medication Sig Dispense Refill  . acetaminophen (TYLENOL) 500 MG tablet Take 2 tablets (1,000 mg total) by mouth every 6 (six) hours as needed. 30 tablet 0  . amLODipine (NORVASC) 10 MG tablet Take 1 tablet (10 mg total) by mouth daily. 30 tablet 11  . aspirin EC 81 MG tablet Take 81 mg by mouth daily.    . carvedilol (COREG) 12.5 MG tablet Take 3 tablets (37.5 mg total) by mouth 2 (two) times daily. 180 tablet 11  . cyclobenzaprine (FLEXERIL) 10 MG tablet Take 1 tablet (10 mg total) by mouth 2 (two) times daily as needed for muscle spasms. 20 tablet 0  . emtricitabine-tenofovir ([name]) 200-300 MG tablet Take 1 tablet by mouth daily.    . hydrochlorothiazide (HYDRODIURIL) 25 MG tablet Take 25 mg by mouth daily.    . methylPREDNISolone (MEDROL  DOSEPAK) 4 MG TBPK tablet Per dose pack instruction 21 tablet 0  . ondansetron (ZOFRAN ODT) 4 MG disintegrating tablet Take 1 tablet (4 mg total) by mouth every 8 (eight) hours as needed for nausea or vomiting. 10 tablet 0  . atorvastatin (LIPITOR) 40 MG tablet Take 40 mg by mouth daily.    . isosorbide mononitrate (IMDUR) 60 MG 24 hr tablet Take 1 tablet (60 mg total) by mouth daily. 90 tablet 3  . losartan (COZAAR) 50 MG tablet Take 50 mg by mouth daily.    Marland Kitchen losartan (COZAAR) 50 MG tablet Take 1 tablet (50 mg total) by mouth daily. 90 tablet 3   No current facility-administered medications for this visit.    Allergies:   Lactose and Lisinopril   Social History: Social History   Socioeconomic History  . Marital status: Divorced    Spouse name: Not on file  . Number of children: Not on file  . Years of education: Not on file  . Highest education level: Not on file  Occupational History  . Not on file  Tobacco Use  . Smoking status: Current Every Day Smoker    Packs/day: 1.00  . Smokeless tobacco: Never Used  Vaping Use  . Vaping Use: Never used  Substance and Sexual Activity  . Alcohol use: No  . Drug use: Yes    Types: Marijuana  . Sexual activity: Not on file  Other Topics Concern  . Not on file  Social History Narrative  . Not on file   Social Determinants of Health   Financial Resource Strain: Not on file  Food Insecurity: Not on file  Transportation Needs: Not on file  Physical Activity: Not on file  Stress: Not on file  Social Connections: Not on file  Intimate Partner Violence: Not on file    Family History: History reviewed. No pertinent family history.  Review of Systems: All other systems reviewed and are otherwise negative except as noted above.   Physical Exam: Vitals:   03/16/21 1027  BP: (!) 150/80  Pulse: 60  SpO2: 96%  Weight: 176 lb (79.8 kg)  Height: 6\' 1"  (1.854 m)     GEN- The patient is well appearing, alert and oriented x 3  today.   HEENT: normocephalic, atraumatic; sclera clear, conjunctiva pink; hearing intact; oropharynx clear; neck supple, no JVP Lymph- no cervical lymphadenopathy Lungs- Clear to ausculation bilaterally, normal work of breathing.  No wheezes, rales, rhonchi Heart- Regular rate and rhythm, no murmurs, rubs or gallops, PMI not laterally displaced GI- soft, non-tender, non-distended, bowel sounds present, no hepatosplenomegaly Extremities- no clubbing or cyanosis. No edema; DP/PT/radial pulses 2+ bilaterally MS- no significant deformity or atrophy Skin- warm and dry, no rash or lesion; ICD pocket well healed Psych- euthymic mood, full affect Neuro- strength and sensation are intact  ICD interrogation- reviewed in detail today,  See PACEART report  EKG:  EKG is not ordered today.  Recent Labs: 10/17/2020: ALT 13; BUN 20; Creatinine, Ser 1.38; Hemoglobin 13.2; Platelets 190; Potassium 3.5; Sodium 134   Wt Readings from Last 3 Encounters:  03/16/21 176 lb (79.8 kg)  08/08/20 186 lb 11.2 oz (84.7 kg)  06/08/20 182 lb 12.8 oz (82.9 kg)     Other studies Reviewed: Additional studies/ records that were reviewed today include: Previous EP notes, Previous remotes   Assessment and Plan:  1. HCM s/p [employer] dual chamber ICD  euvolemic today Stable on an appropriate medical regimen Normal ICD function See Pace Art report No changes today  2. HTN Resume losartan and imdur with labs today.   3. NSVT/ VT  Episodes in February included in todays report. Detected but self terminated prior to therapy.  These in the setting of medical non-compliance, he is back on coreg now. We will also resume imdur.   4. DM2 Likely uncontrolled. HgbA1c today. We will work on getting him a PCP. Very likely will need endocrinology referral.   Current medicines are reviewed at length with the patient today.   The patient has concerns regarding his medicines. Primarily how to pay for them. The  following changes were made today:  Refill/resume imdur and losartan.  Labs/ tests ordered today include:  Orders Placed This Encounter  Procedures  . Basic metabolic panel  . CBC  . Hemoglobin A1c    Disposition:   Follow up with EP APP  3 months   Signed, March, PA-C  03/16/2021 10:55 AM  [facility] [address] [place] [phone] (office) [phone] (fax)

## 2021-05-07 ENCOUNTER — Ambulatory Visit (INDEPENDENT_AMBULATORY_CARE_PROVIDER_SITE_OTHER): Payer: BC Managed Care – PPO

## 2021-05-07 DIAGNOSIS — I422 Other hypertrophic cardiomyopathy: Secondary | ICD-10-CM | POA: Diagnosis not present

## 2021-05-11 LAB — CUP PACEART REMOTE DEVICE CHECK
Battery Remaining Longevity: 156 mo
Battery Remaining Percentage: 100 %
Brady Statistic RA Percent Paced: 7 %
Brady Statistic RV Percent Paced: 1 %
Date Time Interrogation Session: 20220512213000
HighPow Impedance: 48 Ohm
Implantable Lead Implant Date: 20210201
Implantable Lead Implant Date: 20210201
Implantable Lead Location: 753862
Implantable Lead Location: 753862
Implantable Lead Model: 273
Implantable Lead Model: 7841
Implantable Lead Serial Number: 1044519
Implantable Lead Serial Number: 109249
Implantable Pulse Generator Implant Date: 20210201
Lead Channel Impedance Value: 427 Ohm
Lead Channel Impedance Value: 693 Ohm
Lead Channel Pacing Threshold Amplitude: 0.4 V
Lead Channel Pacing Threshold Amplitude: 0.7 V
Lead Channel Pacing Threshold Pulse Width: 0.4 ms
Lead Channel Pacing Threshold Pulse Width: 0.4 ms
Lead Channel Setting Pacing Amplitude: 2 V
Lead Channel Setting Pacing Amplitude: 2 V
Lead Channel Setting Pacing Pulse Width: 0.4 ms
Lead Channel Setting Sensing Sensitivity: 0.6 mV
Pulse Gen Serial Number: 593625

## 2021-05-25 NOTE — Progress Notes (Signed)
Remote ICD transmission.   

## 2021-06-18 ENCOUNTER — Encounter: Payer: BC Managed Care – PPO | Admitting: Student

## 2021-06-18 NOTE — Progress Notes (Deleted)
Electrophysiology Office Note Date: 06/18/2021  ID:  Johnny Schroeder, DOB 1966-12-28, MRN [id number]  PCP: Patient, No Pcp Per (Inactive) Primary Cardiologist: Armanda Magic, MD Electrophysiologist: Sherryl Manges, MD   CC: Routine ICD follow-up  Johnny Schroeder is a 55 y.o. male seen today for Sherryl Manges, MD for routine electrophysiology followup.  Since last being seen in our clinic the patient reports doing ***.  he denies chest pain, palpitations, dyspnea, PND, orthopnea, nausea, vomiting, dizziness, syncope, edema, weight gain, or early satiety. He has not had ICD shocks.   Device History: BSci dual chamber ICD implanted 01/31/20, ([employer]), primary prevention    Past Medical History:  Diagnosis Date   CHF (congestive heart failure) (HCC)    COPD (chronic obstructive pulmonary disease) (HCC)    Diabetes mellitus without complication (HCC)    Hypertension    No past surgical history on file.  Current Outpatient Medications  Medication Sig Dispense Refill   acetaminophen (TYLENOL) 500 MG tablet Take 2 tablets (1,000 mg total) by mouth every 6 (six) hours as needed. 30 tablet 0   amLODipine (NORVASC) 10 MG tablet Take 1 tablet (10 mg total) by mouth daily. 30 tablet 11   aspirin EC 81 MG tablet Take 81 mg by mouth daily.     atorvastatin (LIPITOR) 40 MG tablet Take 40 mg by mouth daily.     carvedilol (COREG) 12.5 MG tablet Take 3 tablets (37.5 mg total) by mouth 2 (two) times daily. 180 tablet 11   cyclobenzaprine (FLEXERIL) 10 MG tablet Take 1 tablet (10 mg total) by mouth 2 (two) times daily as needed for muscle spasms. 20 tablet 0   emtricitabine-tenofovir ([name]) 200-300 MG tablet Take 1 tablet by mouth daily.     hydrochlorothiazide (HYDRODIURIL) 25 MG tablet Take 25 mg by mouth daily.     isosorbide mononitrate (IMDUR) 60 MG 24 hr tablet Take 1 tablet (60 mg total) by mouth daily. 90 tablet 3   losartan (COZAAR) 50 MG tablet Take 50 mg by mouth daily.     losartan  (COZAAR) 50 MG tablet Take 1 tablet (50 mg total) by mouth daily. 90 tablet 3   methylPREDNISolone (MEDROL DOSEPAK) 4 MG TBPK tablet Per dose pack instruction 21 tablet 0   ondansetron (ZOFRAN ODT) 4 MG disintegrating tablet Take 1 tablet (4 mg total) by mouth every 8 (eight) hours as needed for nausea or vomiting. 10 tablet 0   No current facility-administered medications for this visit.    Allergies:   Lactose and Lisinopril   Social History: Social History   Socioeconomic History   Marital status: Divorced    Spouse name: Not on file   Number of children: Not on file   Years of education: Not on file   Highest education level: Not on file  Occupational History   Not on file  Tobacco Use   Smoking status: Every Day    Packs/day: 1.00    Pack years: 0.00    Types: Cigarettes   Smokeless tobacco: Never  Vaping Use   Vaping Use: Never used  Substance and Sexual Activity   Alcohol use: No   Drug use: Yes    Types: Marijuana   Sexual activity: Not on file  Other Topics Concern   Not on file  Social History Narrative   Not on file   Social Determinants of Health   Financial Resource Strain: Not on file  Food Insecurity: Not on file  Transportation Needs: Not on file  Physical Activity: Not on file  Stress: Not on file  Social Connections: Not on file  Intimate Partner Violence: Not on file    Family History: No family history on file.  Review of Systems: All other systems reviewed and are otherwise negative except as noted above.   Physical Exam: There were no vitals filed for this visit.   GEN- The patient is well appearing, alert and oriented x 3 today.   HEENT: normocephalic, atraumatic; sclera clear, conjunctiva pink; hearing intact; oropharynx clear; neck supple, no JVP Lymph- no cervical lymphadenopathy Lungs- Clear to ausculation bilaterally, normal work of breathing.  No wheezes, rales, rhonchi Heart- Regular rate and rhythm, no murmurs, rubs or  gallops, PMI not laterally displaced GI- soft, non-tender, non-distended, bowel sounds present, no hepatosplenomegaly Extremities- no clubbing or cyanosis. No edema; DP/PT/radial pulses 2+ bilaterally MS- no significant deformity or atrophy Skin- warm and dry, no rash or lesion; ICD pocket well healed Psych- euthymic mood, full affect Neuro- strength and sensation are intact  ICD interrogation- reviewed in detail today,  See PACEART report  EKG:  EKG is not ordered today.   Recent Labs: 10/17/2020: ALT 13 03/16/2021: BUN 20; Creatinine, Ser 1.12; Hemoglobin 13.3; Platelets 222; Potassium 4.2; Sodium 138   Wt Readings from Last 3 Encounters:  03/16/21 176 lb (79.8 kg)  08/08/20 186 lb 11.2 oz (84.7 kg)  06/08/20 182 lb 12.8 oz (82.9 kg)     Other studies Reviewed: Additional studies/ records that were reviewed today include: Previous EP office notes   Assessment and Plan:  1.  HCM  s/p [employer] dual chamber ICD  euvolemic today Stable on an appropriate medical regimen Normal ICD function See Pace Art report No changes today  2. HTN Continue losartan and imdur    3. NSVT/ VT  Continue coreg and imdur.   4. DM2 HgbA1c 6.3 03/16/21 Encouraged PCP follow up.   Current medicines are reviewed at length with the patient today.   The patient does not have concerns regarding his medicines.  The following changes were made today:  {NONE DEFAULTED:18576}  Labs/ tests ordered today include: *** No orders of the defined types were placed in this encounter.    Disposition:   Follow up with Dr. Graciela Husbands  6 months   Signed, Graciella Freer, PA-C  06/18/2021 9:05 AM  [facility] [address] [place] [address] [phone] (office) [phone] (fax)

## 2021-08-06 ENCOUNTER — Other Ambulatory Visit: Payer: Self-pay

## 2021-08-06 ENCOUNTER — Ambulatory Visit (INDEPENDENT_AMBULATORY_CARE_PROVIDER_SITE_OTHER): Payer: BC Managed Care – PPO

## 2021-08-06 ENCOUNTER — Emergency Department (HOSPITAL_COMMUNITY)
Admission: EM | Admit: 2021-08-06 | Discharge: 2021-08-06 | Disposition: A | Discharge status: Home / Self Care | Payer: BC Managed Care – PPO | Source: Home / Self Care

## 2021-08-06 DIAGNOSIS — R110 Nausea: Secondary | ICD-10-CM | POA: Insufficient documentation

## 2021-08-06 DIAGNOSIS — Z95810 Presence of automatic (implantable) cardiac defibrillator: Secondary | ICD-10-CM | POA: Diagnosis not present

## 2021-08-06 DIAGNOSIS — I422 Other hypertrophic cardiomyopathy: Secondary | ICD-10-CM | POA: Diagnosis not present

## 2021-08-06 DIAGNOSIS — R197 Diarrhea, unspecified: Secondary | ICD-10-CM | POA: Insufficient documentation

## 2021-08-06 DIAGNOSIS — R55 Syncope and collapse: Secondary | ICD-10-CM | POA: Insufficient documentation

## 2021-08-06 DIAGNOSIS — F17210 Nicotine dependence, cigarettes, uncomplicated: Secondary | ICD-10-CM | POA: Diagnosis not present

## 2021-08-06 DIAGNOSIS — I421 Obstructive hypertrophic cardiomyopathy: Secondary | ICD-10-CM | POA: Diagnosis not present

## 2021-08-06 DIAGNOSIS — I472 Ventricular tachycardia: Secondary | ICD-10-CM | POA: Diagnosis not present

## 2021-08-06 DIAGNOSIS — Z5321 Procedure and treatment not carried out due to patient leaving prior to being seen by health care provider: Secondary | ICD-10-CM | POA: Insufficient documentation

## 2021-08-06 DIAGNOSIS — E119 Type 2 diabetes mellitus without complications: Secondary | ICD-10-CM | POA: Diagnosis not present

## 2021-08-06 DIAGNOSIS — Z9119 Patient's noncompliance with other medical treatment and regimen: Secondary | ICD-10-CM | POA: Diagnosis not present

## 2021-08-06 DIAGNOSIS — R519 Headache, unspecified: Secondary | ICD-10-CM | POA: Insufficient documentation

## 2021-08-06 DIAGNOSIS — I5032 Chronic diastolic (congestive) heart failure: Secondary | ICD-10-CM | POA: Diagnosis not present

## 2021-08-06 DIAGNOSIS — Z79899 Other long term (current) drug therapy: Secondary | ICD-10-CM | POA: Diagnosis not present

## 2021-08-06 DIAGNOSIS — Z7984 Long term (current) use of oral hypoglycemic drugs: Secondary | ICD-10-CM | POA: Diagnosis not present

## 2021-08-06 DIAGNOSIS — E785 Hyperlipidemia, unspecified: Secondary | ICD-10-CM | POA: Diagnosis not present

## 2021-08-06 DIAGNOSIS — R42 Dizziness and giddiness: Secondary | ICD-10-CM | POA: Diagnosis not present

## 2021-08-06 DIAGNOSIS — D638 Anemia in other chronic diseases classified elsewhere: Secondary | ICD-10-CM | POA: Diagnosis not present

## 2021-08-06 DIAGNOSIS — I517 Cardiomegaly: Secondary | ICD-10-CM | POA: Diagnosis not present

## 2021-08-06 DIAGNOSIS — I4510 Unspecified right bundle-branch block: Secondary | ICD-10-CM | POA: Diagnosis not present

## 2021-08-06 DIAGNOSIS — J449 Chronic obstructive pulmonary disease, unspecified: Secondary | ICD-10-CM | POA: Diagnosis not present

## 2021-08-06 DIAGNOSIS — Z888 Allergy status to other drugs, medicaments and biological substances status: Secondary | ICD-10-CM | POA: Diagnosis not present

## 2021-08-06 DIAGNOSIS — I158 Other secondary hypertension: Secondary | ICD-10-CM | POA: Diagnosis not present

## 2021-08-06 DIAGNOSIS — Z7982 Long term (current) use of aspirin: Secondary | ICD-10-CM | POA: Diagnosis not present

## 2021-08-06 DIAGNOSIS — Z20822 Contact with and (suspected) exposure to covid-19: Secondary | ICD-10-CM | POA: Diagnosis not present

## 2021-08-06 DIAGNOSIS — Z9114 Patient's other noncompliance with medication regimen: Secondary | ICD-10-CM | POA: Diagnosis not present

## 2021-08-06 DIAGNOSIS — I160 Hypertensive urgency: Secondary | ICD-10-CM | POA: Diagnosis not present

## 2021-08-06 LAB — MAGNESIUM: Magnesium: 1.8 mg/dL (ref 1.7–2.4)

## 2021-08-06 LAB — CBC WITH DIFFERENTIAL/PLATELET
Abs Immature Granulocytes: 0.02 10*3/uL (ref 0.00–0.07)
Basophils Absolute: 0 10*3/uL (ref 0.0–0.1)
Basophils Relative: 0 %
Eosinophils Absolute: 0 10*3/uL (ref 0.0–0.5)
Eosinophils Relative: 0 %
HCT: 39.5 % (ref 39.0–52.0)
Hemoglobin: 13.1 g/dL (ref 13.0–17.0)
Immature Granulocytes: 1 %
Lymphocytes Relative: 34 %
Lymphs Abs: 1.3 10*3/uL (ref 0.7–4.0)
MCH: 32.3 pg (ref 26.0–34.0)
MCHC: 33.2 g/dL (ref 30.0–36.0)
MCV: 97.3 fL (ref 80.0–100.0)
Monocytes Absolute: 0.3 10*3/uL (ref 0.1–1.0)
Monocytes Relative: 7 %
Neutro Abs: 2.3 10*3/uL (ref 1.7–7.7)
Neutrophils Relative %: 58 %
Platelets: 206 10*3/uL (ref 150–400)
RBC: 4.06 MIL/uL — ABNORMAL LOW (ref 4.22–5.81)
RDW: 13 % (ref 11.5–15.5)
WBC: 4 10*3/uL (ref 4.0–10.5)
nRBC: 0 % (ref 0.0–0.2)

## 2021-08-06 LAB — COMPREHENSIVE METABOLIC PANEL
ALT: 23 U/L (ref 0–44)
AST: 25 U/L (ref 15–41)
Albumin: 3.3 g/dL — ABNORMAL LOW (ref 3.5–5.0)
Alkaline Phosphatase: 71 U/L (ref 38–126)
Anion gap: 8 (ref 5–15)
BUN: 22 mg/dL — ABNORMAL HIGH (ref 6–20)
CO2: 26 mmol/L (ref 22–32)
Calcium: 8.9 mg/dL (ref 8.9–10.3)
Chloride: 103 mmol/L (ref 98–111)
Creatinine, Ser: 1.46 mg/dL — ABNORMAL HIGH (ref 0.61–1.24)
GFR, Estimated: 57 mL/min — ABNORMAL LOW (ref >60)
Glucose, Bld: 246 mg/dL — ABNORMAL HIGH (ref 70–99)
Potassium: 4.2 mmol/L (ref 3.5–5.1)
Sodium: 137 mmol/L (ref 135–145)
Total Bilirubin: 0.7 mg/dL (ref 0.3–1.2)
Total Protein: 6.8 g/dL (ref 6.5–8.1)

## 2021-08-06 LAB — LIPASE, BLOOD: Lipase: 32 U/L (ref 11–51)

## 2021-08-06 NOTE — ED Provider Notes (Signed)
Emergency Medicine Provider Triage Evaluation Note  Johnny Schroeder , a 55 y.o. male  was evaluated in triage.  Pt complains of episode of syncope today.  States that he ate chicken and then 1 hour later began to feel nauseous and experienced diarrhea.  States he stood up quickly to go to the restroom and experienced syncopal episode.  His head on the wall he suspects.  Headache now, otherwise feeling better, remains nauseous.  Review of Systems  Positive: Nausea, diarrhea, syncopal episode, headache Negative: Fevers or chills  Physical Exam  BP (!) 194/87   Pulse 82   Temp 98.9 F (37.2 C) (Oral)   Resp 16   SpO2 99%  Gen:   Awake, no distress  Resp:  Normal effort  MSK:   Moves extremities without difficulty  Other:  PERRL, EOMI, no focal deficit on neuro exam.  Head is without deformity, hematoma, or laceration.  Abdomen soft, distended, nontender.  Medical Decision Making  Medically screening exam initiated at 4:00 PM.  Appropriate orders placed.  Fate Caster was informed that the remainder of the evaluation will be completed by another provider, this initial triage assessment does not replace that evaluation, and the importance of remaining in the ED until their evaluation is complete.  This chart was dictated using voice recognition software, Dragon. Despite the best efforts of this provider to proofread and correct errors, errors may still occur which can change documentation meaning.    Paris Lore, PA-C 08/06/21 1601    Gloris Manchester, MD 08/06/21 [phone]

## 2021-08-06 NOTE — ED Triage Notes (Signed)
Pt here from work. )Pt states he ate some bad chicken. About 30 mins after eating pt became sick to stomach, episode of diarrhea and had syncopal episode.

## 2021-08-06 NOTE — ED Notes (Signed)
Pt stated he was leaving go to see Dr. In the am.

## 2021-08-07 ENCOUNTER — Emergency Department (HOSPITAL_COMMUNITY): Payer: BC Managed Care – PPO

## 2021-08-07 ENCOUNTER — Inpatient Hospital Stay (HOSPITAL_COMMUNITY)
Admission: EM | Admit: 2021-08-07 | Discharge: 2021-08-09 | DRG: 309 | Disposition: A | Discharge status: Home / Self Care | Payer: BC Managed Care – PPO | Attending: Internal Medicine | Admitting: Internal Medicine

## 2021-08-07 ENCOUNTER — Other Ambulatory Visit: Payer: Self-pay

## 2021-08-07 ENCOUNTER — Encounter (HOSPITAL_COMMUNITY): Payer: Self-pay | Admitting: Pharmacy Technician

## 2021-08-07 DIAGNOSIS — I160 Hypertensive urgency: Secondary | ICD-10-CM | POA: Diagnosis present

## 2021-08-07 DIAGNOSIS — F17210 Nicotine dependence, cigarettes, uncomplicated: Secondary | ICD-10-CM | POA: Diagnosis present

## 2021-08-07 DIAGNOSIS — Z9119 Patient's noncompliance with other medical treatment and regimen: Secondary | ICD-10-CM

## 2021-08-07 DIAGNOSIS — D638 Anemia in other chronic diseases classified elsewhere: Secondary | ICD-10-CM | POA: Diagnosis present

## 2021-08-07 DIAGNOSIS — I5032 Chronic diastolic (congestive) heart failure: Secondary | ICD-10-CM | POA: Diagnosis present

## 2021-08-07 DIAGNOSIS — Z20822 Contact with and (suspected) exposure to covid-19: Secondary | ICD-10-CM | POA: Diagnosis present

## 2021-08-07 DIAGNOSIS — Z7984 Long term (current) use of oral hypoglycemic drugs: Secondary | ICD-10-CM | POA: Diagnosis not present

## 2021-08-07 DIAGNOSIS — Z79899 Other long term (current) drug therapy: Secondary | ICD-10-CM

## 2021-08-07 DIAGNOSIS — I472 Ventricular tachycardia: Secondary | ICD-10-CM | POA: Diagnosis present

## 2021-08-07 DIAGNOSIS — Z7982 Long term (current) use of aspirin: Secondary | ICD-10-CM

## 2021-08-07 DIAGNOSIS — Z720 Tobacco use: Secondary | ICD-10-CM | POA: Diagnosis present

## 2021-08-07 DIAGNOSIS — I158 Other secondary hypertension: Secondary | ICD-10-CM | POA: Diagnosis present

## 2021-08-07 DIAGNOSIS — R55 Syncope and collapse: Secondary | ICD-10-CM

## 2021-08-07 DIAGNOSIS — Z9114 Patient's other noncompliance with medication regimen: Secondary | ICD-10-CM | POA: Diagnosis not present

## 2021-08-07 DIAGNOSIS — I422 Other hypertrophic cardiomyopathy: Secondary | ICD-10-CM

## 2021-08-07 DIAGNOSIS — E785 Hyperlipidemia, unspecified: Secondary | ICD-10-CM | POA: Diagnosis present

## 2021-08-07 DIAGNOSIS — E119 Type 2 diabetes mellitus without complications: Secondary | ICD-10-CM | POA: Diagnosis present

## 2021-08-07 DIAGNOSIS — Z8679 Personal history of other diseases of the circulatory system: Secondary | ICD-10-CM

## 2021-08-07 DIAGNOSIS — I421 Obstructive hypertrophic cardiomyopathy: Secondary | ICD-10-CM | POA: Diagnosis present

## 2021-08-07 DIAGNOSIS — Z888 Allergy status to other drugs, medicaments and biological substances status: Secondary | ICD-10-CM

## 2021-08-07 DIAGNOSIS — Z95810 Presence of automatic (implantable) cardiac defibrillator: Secondary | ICD-10-CM

## 2021-08-07 DIAGNOSIS — J449 Chronic obstructive pulmonary disease, unspecified: Secondary | ICD-10-CM | POA: Diagnosis present

## 2021-08-07 DIAGNOSIS — D539 Nutritional anemia, unspecified: Secondary | ICD-10-CM | POA: Diagnosis present

## 2021-08-07 LAB — CBC WITH DIFFERENTIAL/PLATELET
Abs Immature Granulocytes: 0.02 10*3/uL (ref 0.00–0.07)
Basophils Absolute: 0 10*3/uL (ref 0.0–0.1)
Basophils Relative: 0 %
Eosinophils Absolute: 0 10*3/uL (ref 0.0–0.5)
Eosinophils Relative: 0 %
HCT: 41.4 % (ref 39.0–52.0)
Hemoglobin: 13.4 g/dL (ref 13.0–17.0)
Immature Granulocytes: 0 %
Lymphocytes Relative: 40 %
Lymphs Abs: 1.8 10*3/uL (ref 0.7–4.0)
MCH: 31.9 pg (ref 26.0–34.0)
MCHC: 32.4 g/dL (ref 30.0–36.0)
MCV: 98.6 fL (ref 80.0–100.0)
Monocytes Absolute: 0.4 10*3/uL (ref 0.1–1.0)
Monocytes Relative: 8 %
Neutro Abs: 2.3 10*3/uL (ref 1.7–7.7)
Neutrophils Relative %: 52 %
Platelets: 202 10*3/uL (ref 150–400)
RBC: 4.2 MIL/uL — ABNORMAL LOW (ref 4.22–5.81)
RDW: 13.1 % (ref 11.5–15.5)
WBC: 4.5 10*3/uL (ref 4.0–10.5)
nRBC: 0 % (ref 0.0–0.2)

## 2021-08-07 LAB — GLUCOSE, CAPILLARY: Glucose-Capillary: 269 mg/dL — ABNORMAL HIGH (ref 70–99)

## 2021-08-07 LAB — COMPREHENSIVE METABOLIC PANEL
ALT: 23 U/L (ref 0–44)
AST: 27 U/L (ref 15–41)
Albumin: 3.2 g/dL — ABNORMAL LOW (ref 3.5–5.0)
Alkaline Phosphatase: 73 U/L (ref 38–126)
Anion gap: 9 (ref 5–15)
BUN: 23 mg/dL — ABNORMAL HIGH (ref 6–20)
CO2: 24 mmol/L (ref 22–32)
Calcium: 8.9 mg/dL (ref 8.9–10.3)
Chloride: 103 mmol/L (ref 98–111)
Creatinine, Ser: 1.21 mg/dL (ref 0.61–1.24)
GFR, Estimated: >60 mL/min (ref >60)
Glucose, Bld: 243 mg/dL — ABNORMAL HIGH (ref 70–99)
Potassium: 4 mmol/L (ref 3.5–5.1)
Sodium: 136 mmol/L (ref 135–145)
Total Bilirubin: 0.7 mg/dL (ref 0.3–1.2)
Total Protein: 6.8 g/dL (ref 6.5–8.1)

## 2021-08-07 LAB — TROPONIN I (HIGH SENSITIVITY)
Troponin I (High Sensitivity): 208 ng/L (ref <18)
Troponin I (High Sensitivity): 198 ng/L (ref <18)

## 2021-08-07 LAB — BRAIN NATRIURETIC PEPTIDE: B Natriuretic Peptide: 325.4 pg/mL — ABNORMAL HIGH (ref 0.0–100.0)

## 2021-08-07 LAB — RESP PANEL BY RT-PCR (FLU A&B, COVID) ARPGX2
Influenza A by PCR: NEGATIVE
Influenza B by PCR: NEGATIVE
SARS Coronavirus 2 by RT PCR: NEGATIVE

## 2021-08-07 MED ORDER — LABETALOL HCL 5 MG/ML IV SOLN
5.0000 mg | INTRAVENOUS | Status: DC | PRN
  Administered 2021-08-07 – 2021-08-08 (×3): 5 mg via INTRAVENOUS
  Filled 2021-08-07 (×3): qty 4

## 2021-08-07 MED ORDER — INSULIN ASPART 100 UNIT/ML IJ SOLN
0.0000 [IU] | Freq: Three times a day (TID) | INTRAMUSCULAR | Status: DC
  Administered 2021-08-08: 7 [IU] via SUBCUTANEOUS
  Administered 2021-08-08: 3 [IU] via SUBCUTANEOUS
  Administered 2021-08-08 – 2021-08-09 (×2): 4 [IU] via SUBCUTANEOUS

## 2021-08-07 MED ORDER — NAPROXEN SODIUM 220 MG PO TABS: 440.0000 mg | ORAL_TABLET | Freq: Every day | ORAL | Status: DC | PRN

## 2021-08-07 MED ORDER — CARVEDILOL 25 MG PO TABS: 37.5000 mg | ORAL_TABLET | Freq: Two times a day (BID) | ORAL | Status: DC

## 2021-08-07 MED ORDER — EMTRICITABINE-TENOFOVIR AF 200-25 MG PO TABS
1.0000 | ORAL_TABLET | Freq: Every day | ORAL | Status: DC
  Administered 2021-08-08 – 2021-08-09 (×2): 1 via ORAL
  Filled 2021-08-07 (×2): qty 1

## 2021-08-07 MED ORDER — LABETALOL HCL 5 MG/ML IV SOLN
5.0000 mg | Freq: Once | INTRAVENOUS | Status: AC
  Administered 2021-08-07: 5 mg via INTRAVENOUS

## 2021-08-07 MED ORDER — ASPIRIN EC 81 MG PO TBEC
81.0000 mg | DELAYED_RELEASE_TABLET | Freq: Every day | ORAL | Status: DC
  Administered 2021-08-08 – 2021-08-09 (×2): 81 mg via ORAL
  Filled 2021-08-07 (×3): qty 1

## 2021-08-07 MED ORDER — LOSARTAN POTASSIUM 50 MG PO TABS
50.0000 mg | ORAL_TABLET | Freq: Every day | ORAL | Status: DC
  Administered 2021-08-08: 50 mg via ORAL
  Filled 2021-08-07 (×2): qty 1

## 2021-08-07 MED ORDER — CARVEDILOL 25 MG PO TABS
37.5000 mg | ORAL_TABLET | Freq: Two times a day (BID) | ORAL | Status: DC
  Administered 2021-08-07 – 2021-08-09 (×4): 37.5 mg via ORAL
  Filled 2021-08-07 (×3): qty 1
  Filled 2021-08-07: qty 3
  Filled 2021-08-07: qty 1

## 2021-08-07 MED ORDER — ONDANSETRON HCL 4 MG PO TABS: 4.0000 mg | ORAL_TABLET | Freq: Four times a day (QID) | ORAL | Status: DC | PRN

## 2021-08-07 MED ORDER — AMLODIPINE BESYLATE 10 MG PO TABS
10.0000 mg | ORAL_TABLET | Freq: Every day | ORAL | Status: DC
  Administered 2021-08-08 – 2021-08-09 (×2): 10 mg via ORAL
  Filled 2021-08-07 (×3): qty 1

## 2021-08-07 MED ORDER — SODIUM CHLORIDE 0.9 % IV BOLUS
500.0000 mL | Freq: Once | INTRAVENOUS | Status: AC
  Administered 2021-08-07: 500 mL via INTRAVENOUS

## 2021-08-07 MED ORDER — GUAIFENESIN 400 MG PO TABS: 800.0000 mg | ORAL_TABLET | Freq: Two times a day (BID) | ORAL | Status: DC | PRN

## 2021-08-07 MED ORDER — ENOXAPARIN SODIUM 40 MG/0.4ML IJ SOSY
40.0000 mg | PREFILLED_SYRINGE | INTRAMUSCULAR | Status: DC
  Administered 2021-08-07 – 2021-08-08 (×2): 40 mg via SUBCUTANEOUS
  Filled 2021-08-07 (×2): qty 0.4

## 2021-08-07 MED ORDER — AMLODIPINE BESYLATE 5 MG PO TABS
10.0000 mg | ORAL_TABLET | Freq: Once | ORAL | Status: AC
  Administered 2021-08-07: 10 mg via ORAL
  Filled 2021-08-07: qty 2

## 2021-08-07 MED ORDER — ISOSORBIDE MONONITRATE ER 60 MG PO TB24
60.0000 mg | ORAL_TABLET | Freq: Every day | ORAL | Status: DC
  Administered 2021-08-08 – 2021-08-09 (×2): 60 mg via ORAL
  Filled 2021-08-07 (×3): qty 1

## 2021-08-07 MED ORDER — HYDROCHLOROTHIAZIDE 25 MG PO TABS
25.0000 mg | ORAL_TABLET | Freq: Every day | ORAL | Status: DC
  Administered 2021-08-08: 25 mg via ORAL
  Filled 2021-08-07 (×2): qty 1

## 2021-08-07 MED ORDER — ATORVASTATIN CALCIUM 40 MG PO TABS
40.0000 mg | ORAL_TABLET | Freq: Every day | ORAL | Status: DC
  Administered 2021-08-08 – 2021-08-09 (×2): 40 mg via ORAL
  Filled 2021-08-07 (×3): qty 1

## 2021-08-07 MED ORDER — ACETAMINOPHEN 500 MG PO TABS: 1000.0000 mg | ORAL_TABLET | Freq: Four times a day (QID) | ORAL | Status: DC | PRN

## 2021-08-07 MED ORDER — ACETAMINOPHEN 500 MG PO TABS
1000.0000 mg | ORAL_TABLET | Freq: Four times a day (QID) | ORAL | Status: DC | PRN
  Administered 2021-08-07: 1000 mg via ORAL
  Filled 2021-08-07 (×2): qty 2

## 2021-08-07 MED ORDER — FLUTICASONE PROPIONATE 50 MCG/ACT NA SUSP
1.0000 | Freq: Every day | NASAL | Status: DC | PRN
  Filled 2021-08-07: qty 16

## 2021-08-07 MED ORDER — INSULIN ASPART 100 UNIT/ML IJ SOLN
0.0000 [IU] | Freq: Every day | INTRAMUSCULAR | Status: DC
  Administered 2021-08-07: 3 [IU] via SUBCUTANEOUS

## 2021-08-07 MED ORDER — ONDANSETRON HCL 4 MG/2ML IJ SOLN: 4.0000 mg | Freq: Four times a day (QID) | INTRAMUSCULAR | Status: DC | PRN

## 2021-08-07 MED ORDER — LABETALOL HCL 5 MG/ML IV SOLN
5.0000 mg | Freq: Once | INTRAVENOUS | Status: AC
  Administered 2021-08-07: 5 mg via INTRAVENOUS
  Filled 2021-08-07: qty 4

## 2021-08-07 NOTE — H&P (Signed)
History and Physical   Johnny Schroeder QIH:[id number] DOB: 1966-08-07 DOA: 08/07/2021  Referring MD/NP/PA: Dr. Alona Bene  PCP: Patient, No Pcp Per (Inactive)   Outpatient Specialists: Dr. Unk Lightning, cardiology  Patient coming from: Home  Chief Complaint: Passing out  HPI: Johnny Schroeder is a 55 y.o. male with medical history significant of hypertrophic obstructive cardiomyopathy with AICD in place, hypertension, diabetes, COPD, diastolic dysfunction CHF who has been noncompliant with medications in the past that was seen in the ER yesterday after having a syncopal episode.  He reported that he ate chicken and then an hour later began feeling nauseous experienced diarrhea.  When he got up to go to the bathroom he sustained syncope.  Patient was seen and evaluated.  His AICD was interrogated showing V. tach.  Patient was discharged home but brought back today due to uncontrolled blood pressure.  His blood pressure has persistently remained elevated.  It was more than 200/100 systolic last diastolic.  He has past history of noncompliance.  So far in the ER he has received his home regimen with additional labetalol and Coreg.  Blood pressure slowly improved with uncontrolled so patient is being admitted with hypertensive urgency...  ED Course: Temperature is 98.5 blood pressure 209/105, pulse 82 respiratory 23 oxygen sat 99% on room air.  CBC and chemistry both within normal except glucose 243.  COVID-19 so far negative.  Head CT without contrast negative.  Patient is being admitted with hypertensive urgency for further evaluation and treatment  Review of Systems: As per HPI otherwise 10 point review of systems negative.    Past Medical History:  Diagnosis Date   CHF (congestive heart failure) (HCC)    COPD (chronic obstructive pulmonary disease) (HCC)    Diabetes mellitus without complication (HCC)    Hypertension     No past surgical history on file.   reports that he has been  smoking. He has been smoking an average of 1 pack per day. He has never used smokeless tobacco. He reports current drug use. Drug: Marijuana. He reports that he does not drink alcohol.  Allergies  Allergen Reactions   Lactose Other (See Comments)    GI upset   Lisinopril Diarrhea and Other (See Comments)    Joint pain    No family history on file.   Prior to Admission medications   Medication Sig Start Date End Date Taking? Authorizing Provider  acetaminophen (TYLENOL) 500 MG tablet Take 2 tablets (1,000 mg total) by mouth every 6 (six) hours as needed. Patient taking differently: Take 1,000 mg by mouth every 6 (six) hours as needed for mild pain. 08/04/20  Yes [name], MD  amLODipine (NORVASC) 10 MG tablet Take 1 tablet (10 mg total) by mouth daily. 06/08/20  Yes Sheilah Pigeon, PA-C  aspirin EC 81 MG tablet Take 81 mg by mouth daily.   Yes [provider]  Aspirin-Acetaminophen (GOODYS BODY PAIN PO) Take 1 Package by mouth 2 (two) times daily as needed (back pain).   Yes [provider]  atorvastatin (LIPITOR) 40 MG tablet Take 40 mg by mouth daily. 03/03/20 08/07/21 Yes [provider]  carvedilol (COREG) 12.5 MG tablet Take 3 tablets (37.5 mg total) by mouth 2 (two) times daily. 06/08/20 08/07/21 Yes Sheilah Pigeon, PA-C  fluticasone (FLONASE) 50 MCG/ACT nasal spray Place 1 spray into both nostrils daily as needed for allergies or rhinitis.   Yes [provider]  guaifenesin (HUMIBID E) 400 MG TABS tablet  Take 800 mg by mouth every 12 (twelve) hours as needed (congestion).   Yes [provider]  hydrochlorothiazide (HYDRODIURIL) 25 MG tablet Take 25 mg by mouth daily. 03/03/20  Yes [provider]  isosorbide mononitrate (IMDUR) 60 MG 24 hr tablet Take 1 tablet (60 mg total) by mouth daily. 03/16/21 03/16/22 Yes Tillery, Mariam Dollar, PA-C  losartan (COZAAR) 50 MG tablet Take 1 tablet (50 mg total) by mouth daily. 03/16/21  Yes  Graciella Freer, PA-C  naproxen sodium (ALEVE) 220 MG tablet Take 440 mg by mouth daily as needed (pain).   Yes [provider]  cyclobenzaprine (FLEXERIL) 10 MG tablet Take 1 tablet (10 mg total) by mouth 2 (two) times daily as needed for muscle spasms. Patient not taking: Reported on 08/07/2021 08/04/20   [name], MD  emtricitabine-tenofovir ([name]) 200-300 MG tablet Take 1 tablet by mouth daily. 06/02/20   [provider]    Physical Exam: Vitals:   08/07/21 1800 08/07/21 1830 08/07/21 1900 08/07/21 1930  BP: (!) 196/104 (!) 190/103 (!) 195/94 (!) 182/93  Pulse: 72 66 67 69  Resp: 17 (!) 23 (!) 22 20  Temp:      TempSrc:      SpO2: 100% 100% 100% 100%      Constitutional: Acutely ill looking no distress Vitals:   08/07/21 1800 08/07/21 1830 08/07/21 1900 08/07/21 1930  BP: (!) 196/104 (!) 190/103 (!) 195/94 (!) 182/93  Pulse: 72 66 67 69  Resp: 17 (!) 23 (!) 22 20  Temp:      TempSrc:      SpO2: 100% 100% 100% 100%   Eyes: PERRL, lids and conjunctivae normal ENMT: Mucous membranes are dry. Posterior pharynx clear of any exudate or lesions.Normal dentition.  Neck: normal, supple, no masses, no thyromegaly Respiratory: clear to auscultation bilaterally, no wheezing, no crackles. Normal respiratory effort. No accessory muscle use.  Cardiovascular: Irregularly irregular with diastolic murmur/ rubs / gallops. No extremity edema. 2+ pedal pulses. No carotid bruits.  Abdomen: no tenderness, no masses palpated. No hepatosplenomegaly. Bowel sounds positive.  Musculoskeletal: no clubbing / cyanosis. No joint deformity upper and lower extremities. Good ROM, no contractures. Normal muscle tone.  Skin: no rashes, lesions, ulcers. No induration Neurologic: CN 2-12 grossly intact. Sensation intact, DTR normal. Strength 5/5 in all 4.  Psychiatric: Normal judgment and insight. Alert and oriented x 3. Normal mood.     Labs on Admission: I have personally  reviewed following labs and imaging studies  CBC: Recent Labs  Lab 08/06/21 1600 08/07/21 1405  WBC 4.0 4.5  NEUTROABS 2.3 2.3  HGB 13.1 13.4  HCT 39.5 41.4  MCV 97.3 98.6  PLT 206 202   Basic Metabolic Panel: Recent Labs  Lab 08/06/21 1600 08/07/21 1405  NA 137 136  K 4.2 4.0  CL 103 103  CO2 26 24  GLUCOSE 246* 243*  BUN 22* 23*  CREATININE 1.46* 1.21  CALCIUM 8.9 8.9  MG 1.8  --    GFR: CrCl cannot be calculated (Unknown ideal weight.). Liver Function Tests: Recent Labs  Lab 08/06/21 1600 08/07/21 1405  AST 25 27  ALT 23 23  ALKPHOS 71 73  BILITOT 0.7 0.7  PROT 6.8 6.8  ALBUMIN 3.3* 3.2*   Recent Labs  Lab 08/06/21 1600  LIPASE 32   No results for input(s): AMMONIA in the last 168 hours. Coagulation Profile: No results for input(s): INR, PROTIME in the last 168 hours. Cardiac Enzymes: No results  for input(s): CKTOTAL, CKMB, CKMBINDEX, TROPONINI in the last 168 hours. BNP (last 3 results) No results for input(s): PROBNP in the last 8760 hours. HbA1C: No results for input(s): HGBA1C in the last 72 hours. CBG: No results for input(s): GLUCAP in the last 168 hours. Lipid Profile: No results for input(s): CHOL, HDL, LDLCALC, TRIG, CHOLHDL, LDLDIRECT in the last 72 hours. Thyroid Function Tests: No results for input(s): TSH, T4TOTAL, FREET4, T3FREE, THYROIDAB in the last 72 hours. Anemia Panel: No results for input(s): VITAMINB12, FOLATE, FERRITIN, TIBC, IRON, RETICCTPCT in the last 72 hours. Urine analysis:    Component Value Date/Time   COLORURINE YELLOW 10/17/2020 2352   APPEARANCEUR CLEAR 10/17/2020 2352   LABSPEC 1.024 10/17/2020 2352   PHURINE 5.0 10/17/2020 2352   GLUCOSEU NEGATIVE 10/17/2020 2352   HGBUR NEGATIVE 10/17/2020 2352   BILIRUBINUR NEGATIVE 10/17/2020 2352   KETONESUR 5 (A) 10/17/2020 2352   PROTEINUR 100 (A) 10/17/2020 2352   NITRITE NEGATIVE 10/17/2020 2352   LEUKOCYTESUR NEGATIVE 10/17/2020 2352   Sepsis  Labs: @LABRCNTIP (procalcitonin:4,lacticidven:4) ) Recent Results (from the past 240 hour(s))  Resp Panel by RT-PCR (Flu A&B, Covid) Nasopharyngeal Swab     Status: None   Collection Time: 08/07/21  3:05 PM   Specimen: Nasopharyngeal Swab; Nasopharyngeal(NP) swabs in vial transport medium  Result Value Ref Range Status   SARS Coronavirus 2 by RT PCR NEGATIVE NEGATIVE Final    Comment: (NOTE) SARS-CoV-2 target nucleic acids are NOT DETECTED.  The SARS-CoV-2 RNA is generally detectable in upper respiratory specimens during the acute phase of infection. The lowest concentration of SARS-CoV-2 viral copies this assay can detect is 138 copies/mL. A negative result does not preclude SARS-Cov-2 infection and should not be used as the sole basis for treatment or other patient management decisions. A negative result may occur with  improper specimen collection/handling, submission of specimen other than nasopharyngeal swab, presence of viral mutation(s) within the areas targeted by this assay, and inadequate number of viral copies(<138 copies/mL). A negative result must be combined with clinical observations, patient history, and epidemiological information. The expected result is Negative.  Fact Sheet for Patients:  10/07/21  Fact Sheet for Healthcare Providers:  BloggerCourse.com  This test is no t yet approved or cleared by the [employer] FDA and  has been authorized for detection and/or diagnosis of SARS-CoV-2 by FDA under an Emergency Use Authorization (EUA). This EUA will remain  in effect (meaning this test can be used) for the duration of the COVID-19 declaration under Section 564(b)(1) of the Act, 21 U.S.C.section 360bbb-3(b)(1), unless the authorization is terminated  or revoked sooner.       Influenza A by PCR NEGATIVE NEGATIVE Final   Influenza B by PCR NEGATIVE NEGATIVE Final    Comment: (NOTE) The Xpert Xpress  SARS-CoV-2/FLU/RSV plus assay is intended as an aid in the diagnosis of influenza from Nasopharyngeal swab specimens and should not be used as a sole basis for treatment. Nasal washings and aspirates are unacceptable for Xpert Xpress SARS-CoV-2/FLU/RSV testing.  Fact Sheet for Patients: [place]  Fact Sheet for Healthcare Providers: BloggerCourse.com  This test is not yet approved or cleared by the [employer].[place] FDA and has been authorized for detection and/or diagnosis of SARS-CoV-2 by FDA under an Emergency Use Authorization (EUA). This EUA will remain in effect (meaning this test can be used) for the duration of the COVID-19 declaration under Section 564(b)(1) of the Act, 21 U.S.C. section 360bbb-3(b)(1), unless the authorization is terminated or revoked.  Performed at [facility], [address], [place], [place] [address]      Radiological Exams on Admission: DG Chest 2 View  Result Date: 08/07/2021 CLINICAL DATA:  Syncope EXAM: CHEST - 2 VIEW COMPARISON:  Chest radiograph 12/10/2019 FINDINGS: A left chest wall cardiac device is in place with 2 associated leads. The leads appear intact. The heart is enlarged, unchanged. The mediastinal contours are within normal limits. There is slight asymmetric elevation of the right hemidiaphragm. There are patchy opacities in the right base seen on the frontal projection with probable correlate opacity projecting over the lower thoracic vertebral bodies on the lateral projection, unchanged since 2020. The lungs are otherwise clear. There is slight blunting of the costophrenic angles, unchanged and likely reflecting scarring. There is no definite pleural effusion. There is no pneumothorax. There is no acute osseous abnormality. IMPRESSION: Patchy opacities projecting over the right lower lobe and lower thoracic vertebral bodies is unchanged since 2020 and may reflect chronic scarring.  Otherwise, no radiographic evidence of acute cardiopulmonary process. Cardiomegaly. Electronically Signed   By: Lesia Hausen MD   On: 08/07/2021 13:36   CT HEAD WO CONTRAST ( )  Result Date: 08/07/2021 CLINICAL DATA:  Head trauma, mod-severe Dizziness, non-specific Patient reports syncope at work. EXAM: CT HEAD WITHOUT CONTRAST TECHNIQUE: Contiguous axial images were obtained from the base of the skull through the vertex without intravenous contrast. COMPARISON:  Head CT 09/30/2019 FINDINGS: Brain: No intracranial hemorrhage, mass effect, or midline shift. No hydrocephalus. The basilar cisterns are patent. No evidence of territorial infarct or acute ischemia. No extra-axial or intracranial fluid collection. Vascular: No hyperdense vessel or unexpected calcification. Skull: No fracture or focal lesion. Sinuses/Orbits: Paranasal sinuses and mastoid air cells are clear. The visualized orbits are unremarkable. Other: None. IMPRESSION: Negative noncontrast head CT. Electronically Signed   By: Narda Rutherford M.D.   On: 08/07/2021 17:41    EKG: Independently reviewed.  Consistent with hypertrophic cardiomyopathy  Assessment/Plan Principal Problem:   Hypertensive urgency, malignant Active Problems:   Anemia, deficiency   Diabetes type 2, controlled (HCC)   History of CHF (congestive heart failure)   Hypertensive urgency   Hypertrophic cardiomyopathy (HCC)   ICD (implantable cardioverter-defibrillator) in place   Tobacco abuse     #1 malignant hypertension: Most likely secondary to noncompliance.  Patient has had no recurrent problems as a result of noncompliance.  Initiate home regimen of hydrochlorothiazide, Cozaar amlodipine, Coreg and monitor response.  IV labetalol.  To help control the blood pressure.  #2 syncope: Patient had his AICD interrogated yesterday showing an episode of V. tach.  It appears he did not have defibrillation then.  We will continue close monitoring.  Cardiology  consult.  #3 hypertrophic cardiomyopathy: Continue regular treatment.  #4 non-insulin-dependent diabetes: Initiate sliding scale insulin.  #5 tobacco abuse: Counseling was tobacco cessation.  #6 anemia of chronic disease: Continue to monitor H&H.  #7 hyperlipidemia: Continue statin   DVT prophylaxis: Lovenox Code Status: Full code Family Communication: No family at bedside Disposition Plan: Home Consults called: Consult cardiology Admission status: Inpatient  Severity of Illness: The appropriate patient status for this patient is INPATIENT. Inpatient status is judged to be reasonable and necessary in order to provide the required intensity of service to ensure the patient's safety. The patient's presenting symptoms, physical exam findings, and initial radiographic and laboratory data in the context of their chronic comorbidities is felt to place them at high risk for further clinical deterioration. Furthermore, it  is not anticipated that the patient will be medically stable for discharge from the hospital within 2 midnights of admission. The following factors support the patient status of inpatient.   " The patient's presenting symptoms include syncope. " The worrisome physical exam findings include markedly elevated blood pressure. " The initial radiographic and laboratory data are worrisome because of abnormal AICD reading. " The chronic co-morbidities include hypertrophic cardiomyopathy.   * I certify that at the point of admission it is my clinical judgment that the patient will require inpatient hospital care spanning beyond 2 midnights from the point of admission due to high intensity of service, high risk for further deterioration and high frequency of surveillance required.Lonia Blood*   Johniya Durfee,LAWAL MD [facility] Pager [phone]  If 7PM-7AM, please contact night-coverage [email or web address] Password [email or web address]  08/07/2021, 8:19 PM

## 2021-08-07 NOTE — Plan of Care (Signed)

## 2021-08-07 NOTE — ED Notes (Signed)
Pacemaker interrogated. [employer] contacted to fax report.

## 2021-08-07 NOTE — ED Provider Notes (Signed)
Emergency Medicine Provider Triage Evaluation Note  Johnny Schroeder , a 55 y.o. male  was evaluated in triage.  Pt complains of syncope times yesterday.  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  He was seen at urgent care today, for syncopal episode.  Prior history of hypertrophic cardiomyopathy.  Arrives today with elevated BPs, still feels unwell nauseated with diarrhea.  Her blood pressure medications for the past few days, has a pacemaker in place.  Review of Systems  Positive: Syncope, diarrhea,nausea Negative: Vomiting, loss of conciouness  Physical Exam  BP (!) 195/97 (BP Location: Right Arm)   Pulse 82   Temp 98.5 F (36.9 C) (Oral)   Resp 17   SpO2 99%  Gen:   Awake, no distress   Resp:  Normal effort  MSK:   Moves extremities without difficulty  Other:    Medical Decision Making  Medically screening exam initiated at 12:36 PM.  Appropriate orders placed.  Johnny Schroeder was informed that the remainder of the evaluation will be completed by another provider, this initial triage assessment does not replace that evaluation, and the importance of remaining in the ED until their evaluation is complete.  Patient presents for the second time status post syncope.  Not evaluated by provider yesterday just seated in the waiting room, seen at urgent care today and sent here for further evaluation.  Extensive past medical history.  Labs along with EKG have been ordered.   Johnny Manges, PA-C 08/07/21 1241    Sloan Leiter, DO 08/07/21 1749

## 2021-08-07 NOTE — ED Triage Notes (Addendum)
Pt here with reports of passing out while at work. Pt states he did fall. Pt reports food was tasting funny yesterday and he started having some nausea and diarrhea. Pt complains of lower back pain and L hip/leg pain. Denies chest pain, shob. Pt reports he did hit his head on the floor when he passed out. Pt with [employer] in place.

## 2021-08-07 NOTE — ED Notes (Signed)
Dr.Long notified of critical troponin of 208.

## 2021-08-07 NOTE — ED Provider Notes (Signed)
Emergency Department Provider Note   I have reviewed the triage vital signs and the nursing notes.   HISTORY  Chief Complaint No chief complaint on file.   HPI Ho Parisi is a 55 y.o. male with PMH HOCM w/ AICD, COPD, DM, and HTN presents to the ED w/ syncope. Symptoms occurred yesterday. Patient ate what he thought was some bad chicken and felt nauseated w/o vomiting but did experience diarrhea. Patient denies any AICD shocks. He came to the ED yesterday but the wait was Garima Chronis and he ultimately left after MSE. He has not had continued symptoms. He is eating without difficulty. No additional syncope/near syncope. No fever or chills. No radiation of symptoms or other modifying factors. BP noted to be high here. Patient notes he has been out of BP meds recently.   Past Medical History:  Diagnosis Date   CHF (congestive heart failure) (HCC)    COPD (chronic obstructive pulmonary disease) (HCC)    Diabetes mellitus without complication (HCC)    Hypertension     Patient Active Problem List   Diagnosis Date Noted   Hypertensive urgency, malignant 08/07/2021   ICD (implantable cardioverter-defibrillator) in place 02/17/2020   Hypertrophic cardiomyopathy (HCC) 02/14/2020   Hypertensive urgency 01/27/2020   Essential hypertension 10/26/2019   Tobacco abuse 06/30/2013   Anemia, deficiency 06/24/2013   Diabetes type 2, controlled (HCC) 06/30/2012   History of CHF (congestive heart failure) 10/18/2011   Obesity, Class II, BMI 35-39.9, with comorbidity 12/19/2010    History reviewed. No pertinent surgical history.  Allergies Lactose and Lisinopril  History reviewed. No pertinent family history.  Social History Social History   Tobacco Use   Smoking status: Every Day    Packs/day: 1.00    Types: Cigarettes   Smokeless tobacco: Never  Vaping Use   Vaping Use: Never used  Substance Use Topics   Alcohol use: No   Drug use: Yes    Types: Marijuana    Review of  Systems  Constitutional: No fever/chills Eyes: No visual changes. ENT: No sore throat. Cardiovascular: Denies chest pain. Positive syncope.  Respiratory: Denies shortness of breath. Gastrointestinal: Mild abdominal pain. Positive nausea, no vomiting. Mild diarrhea.  No constipation. Genitourinary: Negative for dysuria. Musculoskeletal: Negative for back pain. Skin: Negative for rash. Neurological: Negative for headaches, focal weakness or numbness.  10-point ROS otherwise negative.  ____________________________________________   PHYSICAL EXAM:  VITAL SIGNS: ED Triage Vitals  Enc Vitals Group     BP 08/07/21 1051 (!) 195/97     Pulse Rate 08/07/21 1051 82     Resp 08/07/21 1051 17     Temp 08/07/21 1051 98.5 F (36.9 C)     Temp Source 08/07/21 1051 Oral     SpO2 08/07/21 1051 99 %   Constitutional: Alert and oriented. Well appearing and in no acute distress. Eyes: Conjunctivae are normal.  Head: Atraumatic. Nose: No congestion/rhinnorhea. Mouth/Throat: Mucous membranes are moist.  Neck: No stridor.   Cardiovascular: Normal rate, regular rhythm. Good peripheral circulation. Grossly normal heart sounds.   Respiratory: Normal respiratory effort.  No retractions. Lungs CTAB. Gastrointestinal: Soft and nontender. No distention.  Musculoskeletal: No lower extremity tenderness nor edema. No gross deformities of extremities. Neurologic:  Normal speech and language. No gross focal neurologic deficits are appreciated.  Skin:  Skin is warm, dry and intact. No rash noted.   ____________________________________________   LABS (all labs ordered are listed, but only abnormal results are displayed)  Labs Reviewed  CBC WITH  DIFFERENTIAL/PLATELET - Abnormal; Notable for the following components:      Result Value   RBC 4.20 (*)    All other components within normal limits  COMPREHENSIVE METABOLIC PANEL - Abnormal; Notable for the following components:   Glucose, Bld 243 (*)     BUN 23 (*)    Albumin 3.2 (*)    All other components within normal limits  BRAIN NATRIURETIC PEPTIDE - Abnormal; Notable for the following components:   B Natriuretic Peptide 325.4 (*)    All other components within normal limits  COMPREHENSIVE METABOLIC PANEL - Abnormal; Notable for the following components:   Potassium 3.4 (*)    Glucose, Bld 235 (*)    Albumin 3.1 (*)    All other components within normal limits  HEMOGLOBIN A1C - Abnormal; Notable for the following components:   Hgb A1c MFr Bld 6.6 (*)    All other components within normal limits  GLUCOSE, CAPILLARY - Abnormal; Notable for the following components:   Glucose-Capillary 269 (*)    All other components within normal limits  GLUCOSE, CAPILLARY - Abnormal; Notable for the following components:   Glucose-Capillary 210 (*)    All other components within normal limits  GLUCOSE, CAPILLARY - Abnormal; Notable for the following components:   Glucose-Capillary 149 (*)    All other components within normal limits  MAGNESIUM - Abnormal; Notable for the following components:   Magnesium 1.6 (*)    All other components within normal limits  GLUCOSE, CAPILLARY - Abnormal; Notable for the following components:   Glucose-Capillary 194 (*)    All other components within normal limits  GLUCOSE, CAPILLARY - Abnormal; Notable for the following components:   Glucose-Capillary 106 (*)    All other components within normal limits  GLUCOSE, CAPILLARY - Abnormal; Notable for the following components:   Glucose-Capillary 189 (*)    All other components within normal limits  TROPONIN I (HIGH SENSITIVITY) - Abnormal; Notable for the following components:   Troponin I (High Sensitivity) 208 (*)    All other components within normal limits  TROPONIN I (HIGH SENSITIVITY) - Abnormal; Notable for the following components:   Troponin I (High Sensitivity) 198 (*)    All other components within normal limits  RESP PANEL BY RT-PCR (FLU A&B,  COVID) ARPGX2  MRSA NEXT GEN BY PCR, NASAL  CBC  HIV ANTIBODY (ROUTINE TESTING W REFLEX)  GLUCOSE, CAPILLARY   ____________________________________________  EKG  EKG reviewed. Left axis deviation. LVH noted. Similar to Aug 2021 tracing.  ____________________________________________  RADIOLOGY  No results found.  ____________________________________________   PROCEDURES  Procedure(s) performed:   Procedures   ____________________________________________   INITIAL IMPRESSION / ASSESSMENT AND PLAN / ED COURSE  Pertinent labs & imaging results that were available during my care of the patient were reviewed by me and considered in my medical decision making (see chart for details).   Patient w/ HOCM and currently out of BP meds presents with syncope yesterday. Syncope was associated with stomach upset and diarrhea and shortly after standing. Question orthostatic etiology vs vasovagal. Patient has a Environmental manager AICD which we can interrogate. He did hit his head when he fell. Plan for CT imaging with continued HA. No focal neuro deficits to suspect CVA. Troponin elevated to 200 range. No active CP. Plan to trend troponin. Will give home BP meds PO here. ACS low on differential. Question HTN emergency vs chronic trop leak with HOCM. In chart review, the patient had elevated troponin in  09/2019 with somewhat similar presentation. Seen by cardiology who offered admit but patient declined.   Discussed case with Dr. Melburn Popper. Plan for gentle IVF with diarrhea and possible mild dehydration. Continue to follow troponin and control BP. IV labetalol given and SBP now trending below 200.   Discussed patient's case with TR[facility] to request admission. Patient and family (if present) updated with plan. Care transferred to [facility] service.  I reviewed all nursing notes, vitals, pertinent old records, EKGs, labs, imaging (as available).  07:55 PM  Patient's pacemaker interrogation reviewed. Looks  like on 08/06/21 at 13:25 patient had VT episode with ATP x 1 noted. No additional events. Will message admitting MD and leave report at bedside.   ____________________________________________  FINAL CLINICAL IMPRESSION(S) / ED DIAGNOSES  Final diagnoses:  Syncope and collapse     MEDICATIONS GIVEN DURING THIS VISIT:  Medications  amLODipine (NORVASC) tablet 10 mg (10 mg Oral Given 08/07/21 1608)  labetalol (NORMODYNE) injection 5 mg (5 mg Intravenous Given 08/07/21 1623)  labetalol (NORMODYNE) injection 5 mg (5 mg Intravenous Given 08/07/21 1821)  sodium chloride 0.9 % bolus 500 mL (0 mLs Intravenous Stopped 08/07/21 2001)  potassium chloride (KLOR-CON) CR tablet 30 mEq (30 mEq Oral Given 08/08/21 1714)  magnesium sulfate IVPB 2 g 50 mL (2 g Intravenous New Bag/Given 08/08/21 2336)  potassium chloride SA (KLOR-CON) CR tablet 20 mEq (20 mEq Oral Given 08/08/21 2336)     NEW OUTPATIENT MEDICATIONS STARTED DURING THIS VISIT:  Discharge Medication List as of 08/09/2021 11:04 AM     START taking these medications   Details  nicotine polacrilex (NICORETTE) 2 MG gum Take 1 each (2 mg total) by mouth as needed for smoking cessation., Starting Thu 08/09/2021, Normal    emtricitabine-tenofovir AF ([name]) 200-25 MG tablet Take 1 tablet by mouth daily., Starting Thu 08/09/2021, Normal        Note:  This document was prepared using Dragon voice recognition software and may include unintentional dictation errors.  Alona Bene, MD, [facility] Emergency Medicine    Mahasin Riviere, Arlyss Repress, MD 08/10/21 [phone]

## 2021-08-08 DIAGNOSIS — I160 Hypertensive urgency: Secondary | ICD-10-CM

## 2021-08-08 LAB — COMPREHENSIVE METABOLIC PANEL
ALT: 24 U/L (ref 0–44)
AST: 25 U/L (ref 15–41)
Albumin: 3.1 g/dL — ABNORMAL LOW (ref 3.5–5.0)
Alkaline Phosphatase: 62 U/L (ref 38–126)
Anion gap: 10 (ref 5–15)
BUN: 17 mg/dL (ref 6–20)
CO2: 25 mmol/L (ref 22–32)
Calcium: 9 mg/dL (ref 8.9–10.3)
Chloride: 101 mmol/L (ref 98–111)
Creatinine, Ser: 1.05 mg/dL (ref 0.61–1.24)
GFR, Estimated: >60 mL/min (ref >60)
Glucose, Bld: 235 mg/dL — ABNORMAL HIGH (ref 70–99)
Potassium: 3.4 mmol/L — ABNORMAL LOW (ref 3.5–5.1)
Sodium: 136 mmol/L (ref 135–145)
Total Bilirubin: 0.3 mg/dL (ref 0.3–1.2)
Total Protein: 6.7 g/dL (ref 6.5–8.1)

## 2021-08-08 LAB — CBC
HCT: 40.3 % (ref 39.0–52.0)
Hemoglobin: 13.6 g/dL (ref 13.0–17.0)
MCH: 32.1 pg (ref 26.0–34.0)
MCHC: 33.7 g/dL (ref 30.0–36.0)
MCV: 95 fL (ref 80.0–100.0)
Platelets: 208 10*3/uL (ref 150–400)
RBC: 4.24 MIL/uL (ref 4.22–5.81)
RDW: 12.9 % (ref 11.5–15.5)
WBC: 4.3 10*3/uL (ref 4.0–10.5)
nRBC: 0 % (ref 0.0–0.2)

## 2021-08-08 LAB — GLUCOSE, CAPILLARY
Glucose-Capillary: 210 mg/dL — ABNORMAL HIGH (ref 70–99)
Glucose-Capillary: 98 mg/dL (ref 70–99)
Glucose-Capillary: 149 mg/dL — ABNORMAL HIGH (ref 70–99)
Glucose-Capillary: 194 mg/dL — ABNORMAL HIGH (ref 70–99)
Glucose-Capillary: 106 mg/dL — ABNORMAL HIGH (ref 70–99)

## 2021-08-08 LAB — MAGNESIUM: Magnesium: 1.6 mg/dL — ABNORMAL LOW (ref 1.7–2.4)

## 2021-08-08 LAB — HEMOGLOBIN A1C
Hgb A1c MFr Bld: 6.6 % — ABNORMAL HIGH (ref 4.8–5.6)
Mean Plasma Glucose: 142.72 mg/dL

## 2021-08-08 LAB — MRSA NEXT GEN BY PCR, NASAL: MRSA by PCR Next Gen: NOT DETECTED

## 2021-08-08 LAB — HIV ANTIBODY (ROUTINE TESTING W REFLEX): HIV Screen 4th Generation wRfx: NONREACTIVE

## 2021-08-08 MED ORDER — MAGNESIUM SULFATE 2 GM/50ML IV SOLN
2.0000 g | Freq: Once | INTRAVENOUS | Status: AC
  Administered 2021-08-08: 2 g via INTRAVENOUS
  Filled 2021-08-08: qty 50

## 2021-08-08 MED ORDER — POTASSIUM CHLORIDE CRYS ER 20 MEQ PO TBCR
30.0000 meq | EXTENDED_RELEASE_TABLET | ORAL | Status: AC
  Administered 2021-08-08 (×2): 30 meq via ORAL
  Filled 2021-08-08 (×2): qty 1

## 2021-08-08 MED ORDER — NICOTINE POLACRILEX 2 MG MT GUM
2.0000 mg | CHEWING_GUM | OROMUCOSAL | Status: DC | PRN
  Administered 2021-08-08: 2 mg via ORAL
  Filled 2021-08-08 (×3): qty 1

## 2021-08-08 MED ORDER — POTASSIUM CHLORIDE CRYS ER 20 MEQ PO TBCR
20.0000 meq | EXTENDED_RELEASE_TABLET | Freq: Once | ORAL | Status: AC
  Administered 2021-08-08: 20 meq via ORAL
  Filled 2021-08-08: qty 1

## 2021-08-08 NOTE — Progress Notes (Addendum)
PROGRESS NOTE    Johnny Schroeder  HAL:[id number] DOB: [birth year] DOA: 08/07/2021 PCP: Patient, No Pcp Per (Inactive)   Brief Narrative:  Johnny Schroeder is a 55 y.o. male with medical history significant of hypertrophic obstructive cardiomyopathy with AICD in place, hypertension, diabetes, COPD, diastolic dysfunction CHF who has been noncompliant with medications in the past that was seen in the ER yesterday after having a syncopal episode. His AICD was interrogated showing V. tach.  Patient was discharged home but brought back today due to uncontrolled blood pressure.  His blood pressure has persistently remained elevated.  It was more than 200/100 systolic last diastolic.  Assessment & Plan:   Principal Problem:   Hypertensive urgency, malignant Active Problems:   Anemia, deficiency   Diabetes type 2, controlled (HCC)   History of CHF (congestive heart failure)   Hypertensive urgency   Hypertrophic cardiomyopathy (HCC)   ICD (implantable cardioverter-defibrillator) in place   Tobacco abuse   Malignant hypertension:  Secondary to noncompliance.  Patient has had no recurrent problems as a result of noncompliance.  Initiate home regimen of hydrochlorothiazide, Cozaar amlodipine, Coreg and monitor response.  IV labetalol.  To help control the blood pressure.   Syncope: Patient had his AICD interrogated yesterday showing an episode of V. tach.  It appears he did not have defibrillation then.  We will continue close monitoring.  Cardiology consulted.  Profound medication and lifestyle noncompliance  Patient admits to missing multiple medications at home, not following closely with PCP, continues to abuse tobacco despite multiple discussions with previous physicians.  Remains extremely high risk for readmission due to noncompliance with both medication and lifestyle recommendations  Hypertrophic cardiomyopathy: Continue regular treatment.   Non-insulin-dependent diabetes:  A1C 6.6 -  Initiate sliding scale insulin. Discussed dietary/lifestyle modifications  Tobacco abuse: Counseling was tobacco cessation.  Given nicotine gum replacement   Chronic anemia of chronic disease: Continue to monitor H&H.   Hyperlipidemia: Continue statin     DVT prophylaxis: Lovenox Code Status: Full code Family Communication: No family at bedside  Status is: Inpatient  Dispo: The patient is from: Home              Anticipated d/c is to: Same              Anticipated d/c date is: 24 to 48 hours              Patient currently not medically stable for discharge due to ongoing uncontrolled hypertension  Consultants:  Cardiology  Procedures:  None  Antimicrobials:  None indicated  Subjective: No acute issues or events overnight denies nausea vomiting diarrhea constipation headache fevers chills chest pain shortness of breath.  Objective: Vitals:   08/08/21 0500 08/08/21 0530 08/08/21 0600 08/08/21 0630  BP: (!) 185/88 (!) 161/84 (!) 159/86 (!) 192/110  Pulse: (!) 59 (!) 59 (!) 58 62  Resp: 18 18 14 18   Temp:      TempSrc:      SpO2: 97% 97% 98% 94%  Weight:      Height:        Intake/Output Summary (Last 24 hours) at 08/08/2021 0727 Last data filed at 08/07/2021 2300 Gross per 24 hour  Intake 500 ml  Output 200 ml  Net 300 ml   Filed Weights   08/07/21 2245  Weight: 87.1 kg    Examination:  General:  Pleasantly resting in bed, No acute distress. HEENT:  Normocephalic atraumatic.  Sclerae nonicteric, noninjected.  Extraocular movements intact bilaterally. Neck:  Without mass or deformity.  Trachea is midline. Lungs:  Clear to auscultate bilaterally without rhonchi, wheeze, or rales. Heart:  Regular rate and rhythm.  Without murmurs, rubs, or gallops. Abdomen:  Soft, nontender, nondistended.  Without guarding or rebound. Extremities: Without cyanosis, clubbing, edema, or obvious deformity. Vascular:  Dorsalis pedis and posterior tibial pulses palpable  bilaterally. Skin:  Warm and dry, no erythema, no ulcerations.   Data Reviewed: I have personally reviewed following labs and imaging studies  CBC: Recent Labs  Lab 08/06/21 1600 08/07/21 1405 08/08/21 0033  WBC 4.0 4.5 4.3  NEUTROABS 2.3 2.3  --   HGB 13.1 13.4 13.6  HCT 39.5 41.4 40.3  MCV 97.3 98.6 95.0  PLT 206 202 208   Basic Metabolic Panel: Recent Labs  Lab 08/06/21 1600 08/07/21 1405 08/08/21 0033  NA 137 136 136  K 4.2 4.0 3.4*  CL 103 103 101  CO2 26 24 25   GLUCOSE 246* 243* 235*  BUN 22* 23* 17  CREATININE 1.46* 1.21 1.05  CALCIUM 8.9 8.9 9.0  MG 1.8  --   --    GFR: Estimated Creatinine Clearance: 91.9 mL/min (by C-G formula based on SCr of 1.05 mg/dL). Liver Function Tests: Recent Labs  Lab 08/06/21 1600 08/07/21 1405 08/08/21 0033  AST 25 27 25   ALT 23 23 24   ALKPHOS 71 73 62  BILITOT 0.7 0.7 0.3  PROT 6.8 6.8 6.7  ALBUMIN 3.3* 3.2* 3.1*   Recent Labs  Lab 08/06/21 1600  LIPASE 32   No results for input(s): AMMONIA in the last 168 hours. Coagulation Profile: No results for input(s): INR, PROTIME in the last 168 hours. Cardiac Enzymes: No results for input(s): CKTOTAL, CKMB, CKMBINDEX, TROPONINI in the last 168 hours. BNP (last 3 results) No results for input(s): PROBNP in the last 8760 hours. HbA1C: Recent Labs    08/08/21 0033  HGBA1C 6.6*   CBG: Recent Labs  Lab 08/07/21 2332 08/08/21 0337  GLUCAP 269* 210*   Lipid Profile: No results for input(s): CHOL, HDL, LDLCALC, TRIG, CHOLHDL, LDLDIRECT in the last 72 hours. Thyroid Function Tests: No results for input(s): TSH, T4TOTAL, FREET4, T3FREE, THYROIDAB in the last 72 hours. Anemia Panel: No results for input(s): VITAMINB12, FOLATE, FERRITIN, TIBC, IRON, RETICCTPCT in the last 72 hours. Sepsis Labs: No results for input(s): PROCALCITON, LATICACIDVEN in the last 168 hours.  Recent Results (from the past 240 hour(s))  Resp Panel by RT-PCR (Flu A&B, Covid) Nasopharyngeal  Swab     Status: None   Collection Time: 08/07/21  3:05 PM   Specimen: Nasopharyngeal Swab; Nasopharyngeal(NP) swabs in vial transport medium  Result Value Ref Range Status   SARS Coronavirus 2 by RT PCR NEGATIVE NEGATIVE Final    Comment: (NOTE) SARS-CoV-2 target nucleic acids are NOT DETECTED.  The SARS-CoV-2 RNA is generally detectable in upper respiratory specimens during the acute phase of infection. The lowest concentration of SARS-CoV-2 viral copies this assay can detect is 138 copies/mL. A negative result does not preclude SARS-Cov-2 infection and should not be used as the sole basis for treatment or other patient management decisions. A negative result may occur with  improper specimen collection/handling, submission of specimen other than nasopharyngeal swab, presence of viral mutation(s) within the areas targeted by this assay, and inadequate number of viral copies(<138 copies/mL). A negative result must be combined with clinical observations, patient history, and epidemiological information. The expected result is Negative.  Fact Sheet for Patients:  10/07/21  Fact Sheet for  Healthcare Providers:  SeriousBroker.it  This test is no t yet approved or cleared by the [place] and  has been authorized for detection and/or diagnosis of SARS-CoV-2 by FDA under an Emergency Use Authorization (EUA). This EUA will remain  in effect (meaning this test can be used) for the duration of the COVID-19 declaration under Section 564(b)(1) of the Act, 21 U.S.C.section 360bbb-3(b)(1), unless the authorization is terminated  or revoked sooner.       Influenza A by PCR NEGATIVE NEGATIVE Final   Influenza B by PCR NEGATIVE NEGATIVE Final    Comment: (NOTE) The Xpert Xpress SARS-CoV-2/FLU/RSV plus assay is intended as an aid in the diagnosis of influenza from Nasopharyngeal swab specimens and should not be used as a sole  basis for treatment. Nasal washings and aspirates are unacceptable for Xpert Xpress SARS-CoV-2/FLU/RSV testing.  Fact Sheet for Patients: BloggerCourse.com  Fact Sheet for Healthcare Providers: SeriousBroker.it  This test is not yet approved or cleared by the [place] FDA and has been authorized for detection and/or diagnosis of SARS-CoV-2 by FDA under an Emergency Use Authorization (EUA). This EUA will remain in effect (meaning this test can be used) for the duration of the COVID-19 declaration under Section 564(b)(1) of the Act, 21 U.S.C. section 360bbb-3(b)(1), unless the authorization is terminated or revoked.  Performed at [facility], [address] [place], [place] [address]   MRSA Next Gen by PCR, Nasal     Status: None   Collection Time: 08/07/21 10:10 PM   Specimen: Nasal Mucosa; Nasal Swab  Result Value Ref Range Status   MRSA by PCR Next Gen NOT DETECTED NOT DETECTED Final    Comment: (NOTE) The GeneXpert MRSA Assay (FDA approved for NASAL specimens only), is one component of a comprehensive MRSA colonization surveillance program. It is not intended to diagnose MRSA infection nor to guide or monitor treatment for MRSA infections. Test performance is not FDA approved in patients less than 83 years old. Performed at [facility], [address], [place], [place] [address]          Radiology Studies: DG Chest 2 View  Result Date: 08/07/2021 CLINICAL DATA:  Syncope EXAM: CHEST - 2 VIEW COMPARISON:  Chest radiograph 12/10/2019 FINDINGS: A left chest wall cardiac device is in place with 2 associated leads. The leads appear intact. The heart is enlarged, unchanged. The mediastinal contours are within normal limits. There is slight asymmetric elevation of the right hemidiaphragm. There are patchy opacities in the right base seen on the frontal projection with probable correlate opacity projecting over  the lower thoracic vertebral bodies on the lateral projection, unchanged since 2020. The lungs are otherwise clear. There is slight blunting of the costophrenic angles, unchanged and likely reflecting scarring. There is no definite pleural effusion. There is no pneumothorax. There is no acute osseous abnormality. IMPRESSION: Patchy opacities projecting over the right lower lobe and lower thoracic vertebral bodies is unchanged since 2020 and may reflect chronic scarring. Otherwise, no radiographic evidence of acute cardiopulmonary process. Cardiomegaly. Electronically Signed   By: Lesia Hausen MD   On: 08/07/2021 13:36   CT HEAD WO CONTRAST ( )  Result Date: 08/07/2021 CLINICAL DATA:  Head trauma, mod-severe Dizziness, non-specific Patient reports syncope at work. EXAM: CT HEAD WITHOUT CONTRAST TECHNIQUE: Contiguous axial images were obtained from the base of the skull through the vertex without intravenous contrast. COMPARISON:  Head CT 09/30/2019 FINDINGS: Brain: No intracranial hemorrhage, mass effect, or midline  shift. No hydrocephalus. The basilar cisterns are patent. No evidence of territorial infarct or acute ischemia. No extra-axial or intracranial fluid collection. Vascular: No hyperdense vessel or unexpected calcification. Skull: No fracture or focal lesion. Sinuses/Orbits: Paranasal sinuses and mastoid air cells are clear. The visualized orbits are unremarkable. Other: None. IMPRESSION: Negative noncontrast head CT. Electronically Signed   By: Narda RutherfordMelanie  Sanford M.D.   On: 08/07/2021 17:41     Scheduled Meds:  amLODipine  10 mg Oral Daily   aspirin EC  81 mg Oral Daily   atorvastatin  40 mg Oral Daily   carvedilol  37.5 mg Oral BID WC   emtricitabine-tenofovir AF  1 tablet Oral Daily   enoxaparin (LOVENOX) injection  40 mg Subcutaneous Q24H   hydrochlorothiazide  25 mg Oral Daily   insulin aspart  0-20 Units Subcutaneous TID WC   insulin aspart  0-5 Units Subcutaneous QHS   isosorbide  mononitrate  60 mg Oral Daily   losartan  50 mg Oral Daily     LOS: 1 day   Time spent: 30min  Johnny FallenWilliam C Annise Boran, DO [facility]  If 7PM-7AM, please contact night-coverage [email or web address]  08/08/2021, 7:27 AM

## 2021-08-08 NOTE — Progress Notes (Signed)
Mobility Specialist: Progress Note   08/08/21 1621  Mobility  Activity Ambulated in hall  Level of Assistance Independent  Assistive Device None  Distance Ambulated (ft) 800 ft  Mobility Ambulated independently in hallway  Mobility Response Tolerated well  Mobility performed by Mobility specialist  $Mobility charge 1 Mobility   Pre-Mobility: 67 HR, 97% SpO2 Post-Mobility: 76 HR, 184/84 BP, 98% SpO2  Pt independent with bed mobility, to stand, and during ambulation. Pt asx throughout. Pt back to bed after walk with call bell and phone at his side.   [name] Johnny Schroeder Mobility Specialist Mobility Specialist Phone: [phone]

## 2021-08-09 ENCOUNTER — Telehealth: Payer: Self-pay

## 2021-08-09 LAB — CUP PACEART REMOTE DEVICE CHECK
Battery Remaining Longevity: 150 mo
Battery Remaining Percentage: 100 %
Brady Statistic RA Percent Paced: 3 %
Brady Statistic RV Percent Paced: 0 %
Date Time Interrogation Session: 20220809191500
HighPow Impedance: 55 Ohm
Implantable Lead Implant Date: 20210201
Implantable Lead Implant Date: 20210201
Implantable Lead Location: 753862
Implantable Lead Location: 753862
Implantable Lead Model: 273
Implantable Lead Model: 7841
Implantable Lead Serial Number: 1044519
Implantable Lead Serial Number: 109249
Implantable Pulse Generator Implant Date: 20210201
Lead Channel Impedance Value: 428 Ohm
Lead Channel Impedance Value: 699 Ohm
Lead Channel Pacing Threshold Amplitude: 0.4 V
Lead Channel Pacing Threshold Amplitude: 0.7 V
Lead Channel Pacing Threshold Pulse Width: 0.4 ms
Lead Channel Pacing Threshold Pulse Width: 0.4 ms
Lead Channel Setting Pacing Amplitude: 2 V
Lead Channel Setting Pacing Amplitude: 2 V
Lead Channel Setting Pacing Pulse Width: 0.4 ms
Lead Channel Setting Sensing Sensitivity: 0.6 mV
Pulse Gen Serial Number: 593625

## 2021-08-09 LAB — GLUCOSE, CAPILLARY: Glucose-Capillary: 189 mg/dL — ABNORMAL HIGH (ref 70–99)

## 2021-08-09 MED ORDER — HYDRALAZINE HCL 20 MG/ML IJ SOLN: 5.0000 mg | Freq: Four times a day (QID) | INTRAMUSCULAR | Status: DC | PRN

## 2021-08-09 MED ORDER — HYDROCHLOROTHIAZIDE 25 MG PO TABS
50.0000 mg | ORAL_TABLET | Freq: Every day | ORAL | Status: DC
  Administered 2021-08-09: 50 mg via ORAL
  Filled 2021-08-09: qty 2

## 2021-08-09 MED ORDER — LOSARTAN POTASSIUM 100 MG PO TABS: 100.0000 mg | ORAL_TABLET | Freq: Every day | ORAL | 0 refills | Status: AC

## 2021-08-09 MED ORDER — HYDROCHLOROTHIAZIDE 50 MG PO TABS: 50.0000 mg | ORAL_TABLET | Freq: Every day | ORAL | 0 refills | Status: DC

## 2021-08-09 MED ORDER — EMTRICITABINE-TENOFOVIR AF 200-25 MG PO TABS: 1.0000 | ORAL_TABLET | Freq: Every day | ORAL | 1 refills | Status: DC

## 2021-08-09 MED ORDER — LOSARTAN POTASSIUM 50 MG PO TABS
100.0000 mg | ORAL_TABLET | Freq: Every day | ORAL | Status: DC
  Administered 2021-08-09: 100 mg via ORAL
  Filled 2021-08-09: qty 2

## 2021-08-09 MED ORDER — NICOTINE POLACRILEX 2 MG MT GUM: 2.0000 mg | CHEWING_GUM | OROMUCOSAL | 0 refills | Status: AC | PRN

## 2021-08-09 MED ORDER — CARVEDILOL 12.5 MG PO TABS: 37.5000 mg | ORAL_TABLET | Freq: Two times a day (BID) | ORAL | 0 refills | Status: DC

## 2021-08-09 MED ORDER — ACETAMINOPHEN 325 MG PO TABS: 325.0000 mg | ORAL_TABLET | Freq: Four times a day (QID) | ORAL | 0 refills | Status: AC | PRN

## 2021-08-09 NOTE — Progress Notes (Signed)
Pt got discharged to home, discharge instructions provided and patient showed understanding to it, IV taken out,Telemonitor DC,pt left unit with all of the belongings. Pt walked to the Car along with nurse, Dr. Natale Milch aware he is driving to home. Letter to work provided.  Lonia Farber, RN

## 2021-08-09 NOTE — Discharge Summary (Signed)
Physician Discharge Summary  [name] VEL:[id number] DOB: 1966/12/29 DOA: 08/07/2021  PCP: Patient, No Pcp Per (Inactive)  Admit date: 08/07/2021 Discharge date: 08/09/2021  Admitted From: Home Disposition: Home  Recommendations for Outpatient Follow-up:  Follow up with PCP in 1-2 weeks Please obtain BMP/CBC in one week Please follow up with cardiology as scheduled  Home Health: None Equipment/Devices: None  Discharge Condition: Stable CODE STATUS: Full Diet recommendation: Low-salt low-fat cardiac diet  Brief/Interim Summary: Johnny Schroeder is a 55 y.o. male with medical history significant of hypertrophic obstructive cardiomyopathy with AICD in place, hypertension, diabetes, COPD, diastolic dysfunction CHF who has been noncompliant with medications in the past that was seen in the ER yesterday after having a syncopal episode. His AICD was interrogated showing V. tach.  Patient was discharged home but brought back today due to uncontrolled blood pressure.  His blood pressure has persistently remained elevated.  It was more than 200/100 systolic last diastolic.    Malignant hypertension:  Secondary to noncompliance.  Blood pressure improving, not yet to goal but this can be adjusted in the outpatient setting, continue current regimen as below  Syncope:  Patient had his AICD interrogated yesterday showing an episode of V. tach.   It appears he did not have defibrillation then.   No episodes since admission   Profound medication and lifestyle noncompliance  Patient admits to missing multiple medications at home, not following closely with PCP, continues to abuse tobacco despite multiple discussions with previous physicians.  Remains extremely high risk for readmission due to noncompliance with both medication and lifestyle recommendations   Hypertrophic cardiomyopathy:  Continue blood pressure control as above, follow with PCP and cardiology as scheduled    Non-insulin-dependent diabetes:  A1C 6.6 -  Discussed dietary/lifestyle modifications   Tobacco abuse: Counseling was tobacco cessation.  Given nicotine gum replacement   Chronic anemia of chronic disease: Continue to monitor H&H.   Hyperlipidemia: Continue statin   Discharge Instructions  Discharge Instructions     Diet - low sodium heart healthy   Complete by: As directed    Increase activity slowly   Complete by: As directed       Allergies as of 08/09/2021       Reactions   Lactose Other (See Comments)   GI upset   Lisinopril Diarrhea, Other (See Comments)   Joint pain        Medication List     STOP taking these medications    cyclobenzaprine 10 MG tablet Commonly known as: FLEXERIL   GOODYS BODY PAIN PO   naproxen sodium 220 MG tablet Commonly known as: ALEVE       TAKE these medications    acetaminophen 325 MG tablet Commonly known as: TYLENOL Take 1 tablet (325 mg total) by mouth every 6 (six) hours as needed for moderate pain, mild pain, fever or headache. What changed:  medication strength how much to take reasons to take this   amLODipine 10 MG tablet Commonly known as: NORVASC Take 1 tablet (10 mg total) by mouth daily.   aspirin EC 81 MG tablet Take 81 mg by mouth daily.   atorvastatin 40 MG tablet Commonly known as: LIPITOR Take 40 mg by mouth daily.   carvedilol 12.5 MG tablet Commonly known as: COREG Take 3 tablets (37.5 mg total) by mouth 2 (two) times daily with a meal. What changed: when to take this   emtricitabine-tenofovir 200-300 MG tablet Commonly known as: [name] Take 1 tablet by mouth daily.  fluticasone 50 MCG/ACT nasal spray Commonly known as: FLONASE Place 1 spray into both nostrils daily as needed for allergies or rhinitis.   guaifenesin 400 MG Tabs tablet Commonly known as: HUMIBID E Take 800 mg by mouth every 12 (twelve) hours as needed (congestion).   hydrochlorothiazide 50 MG tablet Commonly  known as: HYDRODIURIL Take 1 tablet (50 mg total) by mouth daily. What changed:  medication strength how much to take   isosorbide mononitrate 60 MG 24 hr tablet Commonly known as: IMDUR Take 1 tablet (60 mg total) by mouth daily.   losartan 100 MG tablet Commonly known as: COZAAR Take 1 tablet (100 mg total) by mouth daily. What changed:  medication strength how much to take   nicotine polacrilex 2 MG gum Commonly known as: NICORETTE Take 1 each (2 mg total) by mouth as needed for smoking cessation.        Allergies  Allergen Reactions   Lactose Other (See Comments)    GI upset   Lisinopril Diarrhea and Other (See Comments)    Joint pain    Consultations: Cardiology   Procedures/Studies: DG Chest 2 View  Result Date: 08/07/2021 CLINICAL DATA:  Syncope EXAM: CHEST - 2 VIEW COMPARISON:  Chest radiograph 12/10/2019 FINDINGS: A left chest wall cardiac device is in place with 2 associated leads. The leads appear intact. The heart is enlarged, unchanged. The mediastinal contours are within normal limits. There is slight asymmetric elevation of the right hemidiaphragm. There are patchy opacities in the right base seen on the frontal projection with probable correlate opacity projecting over the lower thoracic vertebral bodies on the lateral projection, unchanged since 2020. The lungs are otherwise clear. There is slight blunting of the costophrenic angles, unchanged and likely reflecting scarring. There is no definite pleural effusion. There is no pneumothorax. There is no acute osseous abnormality. IMPRESSION: Patchy opacities projecting over the right lower lobe and lower thoracic vertebral bodies is unchanged since 2020 and may reflect chronic scarring. Otherwise, no radiographic evidence of acute cardiopulmonary process. Cardiomegaly. Electronically Signed   By: Lesia Hausen MD   On: 08/07/2021 13:36   CT HEAD WO CONTRAST ( )  Result Date: 08/07/2021 CLINICAL DATA:  Head  trauma, mod-severe Dizziness, non-specific Patient reports syncope at work. EXAM: CT HEAD WITHOUT CONTRAST TECHNIQUE: Contiguous axial images were obtained from the base of the skull through the vertex without intravenous contrast. COMPARISON:  Head CT 09/30/2019 FINDINGS: Brain: No intracranial hemorrhage, mass effect, or midline shift. No hydrocephalus. The basilar cisterns are patent. No evidence of territorial infarct or acute ischemia. No extra-axial or intracranial fluid collection. Vascular: No hyperdense vessel or unexpected calcification. Skull: No fracture or focal lesion. Sinuses/Orbits: Paranasal sinuses and mastoid air cells are clear. The visualized orbits are unremarkable. Other: None. IMPRESSION: Negative noncontrast head CT. Electronically Signed   By: Narda Rutherford M.D.   On: 08/07/2021 17:41   CUP PACEART REMOTE DEVICE CHECK  Result Date: 08/09/2021 Latitude consult.  10 NSVT epsodes 3 VT, one with ATPx1 on 8/8.  EGM shows regular AS/VS with PVC followed by sustained VT, ATP delivered x1, rhythm converted to AS/VP. Pt. was seen in the ED for a syncopal episode. Route to triage. LR 08/09/21 see note in epic. Exported to Dr. Graciela Husbands for review. PEP    Subjective: No acute issues or events overnight denies nausea vomiting diarrhea constipation headache fevers chills chest pain shortness of breath   Discharge Exam: Vitals:   08/09/21 0700 08/09/21 1109  BP: Marland Kitchen)  169/79 (!) 152/76  Pulse: 68 63  Resp: 18 16  Temp: 98.2 F (36.8 C) 98.2 F (36.8 C)  SpO2: 94% 98%   Vitals:   08/09/21 0248 08/09/21 0400 08/09/21 0700 08/09/21 1109  BP: (!) 155/81 (!) 155/77 (!) 169/79 (!) 152/76  Pulse: (!) 55 60 68 63  Resp: 19 17 18 16   Temp: 98.3 F (36.8 C)  98.2 F (36.8 C) 98.2 F (36.8 C)  TempSrc: Oral  Oral Oral  SpO2: 98% 96% 94% 98%  Weight:      Height:        General: Pt is alert, awake, not in acute distress Cardiovascular: RRR, S1/S2 +, no rubs, no gallops Respiratory:  CTA bilaterally, no wheezing, no rhonchi Abdominal: Soft, NT, ND, bowel sounds + Extremities: no edema, no cyanosis    The results of significant diagnostics from this hospitalization (including imaging, microbiology, ancillary and laboratory) are listed below for reference.     Microbiology: Recent Results (from the past 240 hour(s))  Resp Panel by RT-PCR (Flu A&B, Covid) Nasopharyngeal Swab     Status: None   Collection Time: 08/07/21  3:05 PM   Specimen: Nasopharyngeal Swab; Nasopharyngeal(NP) swabs in vial transport medium  Result Value Ref Range Status   SARS Coronavirus 2 by RT PCR NEGATIVE NEGATIVE Final    Comment: (NOTE) SARS-CoV-2 target nucleic acids are NOT DETECTED.  The SARS-CoV-2 RNA is generally detectable in upper respiratory specimens during the acute phase of infection. The lowest concentration of SARS-CoV-2 viral copies this assay can detect is 138 copies/mL. A negative result does not preclude SARS-Cov-2 infection and should not be used as the sole basis for treatment or other patient management decisions. A negative result may occur with  improper specimen collection/handling, submission of specimen other than nasopharyngeal swab, presence of viral mutation(s) within the areas targeted by this assay, and inadequate number of viral copies(<138 copies/mL). A negative result must be combined with clinical observations, patient history, and epidemiological information. The expected result is Negative.  Fact Sheet for Patients:  10/07/21  Fact Sheet for Healthcare Providers:  BloggerCourse.com  This test is no t yet approved or cleared by the [place] FDA and  has been authorized for detection and/or diagnosis of SARS-CoV-2 by FDA under an Emergency Use Authorization (EUA). This EUA will remain  in effect (meaning this test can be used) for the duration of the COVID-19 declaration under Section  564(b)(1) of the Act, 21 U.S.C.section 360bbb-3(b)(1), unless the authorization is terminated  or revoked sooner.       Influenza A by PCR NEGATIVE NEGATIVE Final   Influenza B by PCR NEGATIVE NEGATIVE Final    Comment: (NOTE) The Xpert Xpress SARS-CoV-2/FLU/RSV plus assay is intended as an aid in the diagnosis of influenza from Nasopharyngeal swab specimens and should not be used as a sole basis for treatment. Nasal washings and aspirates are unacceptable for Xpert Xpress SARS-CoV-2/FLU/RSV testing.  Fact Sheet for Patients: [place]  Fact Sheet for Healthcare Providers: BloggerCourse.com  This test is not yet approved or cleared by the [place] FDA and has been authorized for detection and/or diagnosis of SARS-CoV-2 by FDA under an Emergency Use Authorization (EUA). This EUA will remain in effect (meaning this test can be used) for the duration of the COVID-19 declaration under Section 564(b)(1) of the Act, 21 U.S.C. section 360bbb-3(b)(1), unless the authorization is terminated or revoked.  Performed at [facility], [address], [place], [place]  MRSA Next Gen by PCR, Nasal     Status: None   Collection Time: 08/07/21 10:10 PM   Specimen: Nasal Mucosa; Nasal Swab  Result Value Ref Range Status   MRSA by PCR Next Gen NOT DETECTED NOT DETECTED Final    Comment: (NOTE) The GeneXpert MRSA Assay (FDA approved for NASAL specimens only), is one component of a comprehensive MRSA colonization surveillance program. It is not intended to diagnose MRSA infection nor to guide or monitor treatment for MRSA infections. Test performance is not FDA approved in patients less than 39 years old. Performed at [facility], [address] [place], [place] [address]      Labs: BNP (last 3 results) Recent Labs    08/07/21 1406  BNP 325.4*   Basic Metabolic Panel: Recent Labs  Lab  08/06/21 1600 08/07/21 1405 08/08/21 0033  NA 137 136 136  K 4.2 4.0 3.4*  CL 103 103 101  CO2 26 24 25   GLUCOSE 246* 243* 235*  BUN 22* 23* 17  CREATININE 1.46* 1.21 1.05  CALCIUM 8.9 8.9 9.0  MG 1.8  --  1.6*   Liver Function Tests: Recent Labs  Lab 08/06/21 1600 08/07/21 1405 08/08/21 0033  AST 25 27 25   ALT 23 23 24   ALKPHOS 71 73 62  BILITOT 0.7 0.7 0.3  PROT 6.8 6.8 6.7  ALBUMIN 3.3* 3.2* 3.1*   Recent Labs  Lab 08/06/21 1600  LIPASE 32   No results for input(s): AMMONIA in the last 168 hours. CBC: Recent Labs  Lab 08/06/21 1600 08/07/21 1405 08/08/21 0033  WBC 4.0 4.5 4.3  NEUTROABS 2.3 2.3  --   HGB 13.1 13.4 13.6  HCT 39.5 41.4 40.3  MCV 97.3 98.6 95.0  PLT 206 202 208   Cardiac Enzymes: No results for input(s): CKTOTAL, CKMB, CKMBINDEX, TROPONINI in the last 168 hours. BNP: Invalid input(s): POCBNP CBG: Recent Labs  Lab 08/08/21 0821 08/08/21 1206 08/08/21 1553 08/08/21 2035 08/09/21 0617  GLUCAP 98 149* 194* 106* 189*   D-Dimer No results for input(s): DDIMER in the last 72 hours. Hgb A1c Recent Labs    08/08/21 0033  HGBA1C 6.6*   Lipid Profile No results for input(s): CHOL, HDL, LDLCALC, TRIG, CHOLHDL, LDLDIRECT in the last 72 hours. Thyroid function studies No results for input(s): TSH, T4TOTAL, T3FREE, THYROIDAB in the last 72 hours.  Invalid input(s): FREET3 Anemia work up No results for input(s): VITAMINB12, FOLATE, FERRITIN, TIBC, IRON, RETICCTPCT in the last 72 hours. Urinalysis    Component Value Date/Time   COLORURINE YELLOW 10/17/2020 2352   APPEARANCEUR CLEAR 10/17/2020 2352   LABSPEC 1.024 10/17/2020 2352   PHURINE 5.0 10/17/2020 2352   GLUCOSEU NEGATIVE 10/17/2020 2352   HGBUR NEGATIVE 10/17/2020 2352   BILIRUBINUR NEGATIVE 10/17/2020 2352   KETONESUR 5 (A) 10/17/2020 2352   PROTEINUR 100 (A) 10/17/2020 2352   NITRITE NEGATIVE 10/17/2020 2352   LEUKOCYTESUR NEGATIVE 10/17/2020 2352   Sepsis  Labs Invalid input(s): PROCALCITONIN,  WBC,  LACTICIDVEN Microbiology Recent Results (from the past 240 hour(s))  Resp Panel by RT-PCR (Flu A&B, Covid) Nasopharyngeal Swab     Status: None   Collection Time: 08/07/21  3:05 PM   Specimen: Nasopharyngeal Swab; Nasopharyngeal(NP) swabs in vial transport medium  Result Value Ref Range Status   SARS Coronavirus 2 by RT PCR NEGATIVE NEGATIVE Final    Comment: (NOTE) SARS-CoV-2 target nucleic acids are NOT DETECTED.  The SARS-CoV-2 RNA is generally detectable in upper respiratory  specimens during the acute phase of infection. The lowest concentration of SARS-CoV-2 viral copies this assay can detect is 138 copies/mL. A negative result does not preclude SARS-Cov-2 infection and should not be used as the sole basis for treatment or other patient management decisions. A negative result may occur with  improper specimen collection/handling, submission of specimen other than nasopharyngeal swab, presence of viral mutation(s) within the areas targeted by this assay, and inadequate number of viral copies(<138 copies/mL). A negative result must be combined with clinical observations, patient history, and epidemiological information. The expected result is Negative.  Fact Sheet for Patients:  BloggerCourse.comhttps://www.fda.gov/media/152166/download  Fact Sheet for Healthcare Providers:  SeriousBroker.ithttps://www.fda.gov/media/152162/download  This test is no t yet approved or cleared by the [place] FDA and  has been authorized for detection and/or diagnosis of SARS-CoV-2 by FDA under an Emergency Use Authorization (EUA). This EUA will remain  in effect (meaning this test can be used) for the duration of the COVID-19 declaration under Section 564(b)(1) of the Act, 21 U.S.C.section 360bbb-3(b)(1), unless the authorization is terminated  or revoked sooner.       Influenza A by PCR NEGATIVE NEGATIVE Final   Influenza B by PCR NEGATIVE NEGATIVE Final    Comment:  (NOTE) The Xpert Xpress SARS-CoV-2/FLU/RSV plus assay is intended as an aid in the diagnosis of influenza from Nasopharyngeal swab specimens and should not be used as a sole basis for treatment. Nasal washings and aspirates are unacceptable for Xpert Xpress SARS-CoV-2/FLU/RSV testing.  Fact Sheet for Patients: BloggerCourse.comhttps://www.fda.gov/media/152166/download  Fact Sheet for Healthcare Providers: SeriousBroker.ithttps://www.fda.gov/media/152162/download  This test is not yet approved or cleared by the [place] FDA and has been authorized for detection and/or diagnosis of SARS-CoV-2 by FDA under an Emergency Use Authorization (EUA). This EUA will remain in effect (meaning this test can be used) for the duration of the COVID-19 declaration under Section 564(b)(1) of the Act, 21 U.S.C. section 360bbb-3(b)(1), unless the authorization is terminated or revoked.  Performed at [facility], [address] [place], [place] [address]   MRSA Next Gen by PCR, Nasal     Status: None   Collection Time: 08/07/21 10:10 PM   Specimen: Nasal Mucosa; Nasal Swab  Result Value Ref Range Status   MRSA by PCR Next Gen NOT DETECTED NOT DETECTED Final    Comment: (NOTE) The GeneXpert MRSA Assay (FDA approved for NASAL specimens only), is one component of a comprehensive MRSA colonization surveillance program. It is not intended to diagnose MRSA infection nor to guide or monitor treatment for MRSA infections. Test performance is not FDA approved in patients less than 53 years old. Performed at [facility], [address] [place], [place] [address]      Time coordinating discharge: Over 30 minutes  SIGNED:   Azucena FallenWilliam C Pate Aylward, DO [facility] 08/09/2021, 7:05 PM Pager   If 7PM-7AM, please contact night-coverage [email or web address]

## 2021-08-09 NOTE — Plan of Care (Signed)

## 2021-08-09 NOTE — Telephone Encounter (Signed)
"  Latitude consult.   10 NSVT epsodes 3 VT, one with ATPx1 on 8/8.  EGM shows regular AS/VS with PVC followed by sustained VT, ATP delivered x1, rhythm converted to AS/VP. Pt. was seen in the ED for a syncopal episode. Route to triage. LR"  Inpatient EMR reviewed. Patient discharged today. Successful telephone call to patient to review shock plan and [place] driving restrictions. Patient verbalized understanding however states he will continue to drive. States he has picked up all medications needed per Discharge Summary. Card consult and device interrogation during ED visit. Per EMR documentation patient refused admit by cards. Will route to Dr. Graciela Husbands for review. Documented non-compliance with medications and lifestyle modifications for HOCM.

## 2021-08-10 ENCOUNTER — Telehealth: Payer: Self-pay | Admitting: Internal Medicine

## 2021-08-10 DIAGNOSIS — I10 Essential (primary) hypertension: Secondary | ICD-10-CM

## 2021-08-10 MED ORDER — AMLODIPINE BESYLATE 10 MG PO TABS: 10.0000 mg | ORAL_TABLET | Freq: Every day | ORAL | 1 refills | Status: AC

## 2021-08-10 NOTE — Telephone Encounter (Signed)
*  STAT* If patient is at the pharmacy, call can be transferred to refill team.   1. Which medications need to be refilled? (please list name of each medication and dose if known)  amLODipine (NORVASC) 10 MG tablet  2. Which pharmacy/location (including street and city if local pharmacy) is medication to be sent to? [facility] [address], [place] - [address]  3. Do they need a 30 day or 90 day supply? 30 day

## 2021-08-10 NOTE — Telephone Encounter (Signed)
Rx sent to pharmacy   

## 2021-08-13 NOTE — Telephone Encounter (Signed)
Pt is scheduled to see Otilio Saber, PA-C on 08/16/2021.

## 2021-08-15 NOTE — Progress Notes (Signed)
Electrophysiology Office Note Date: 08/16/2021  ID:  Johnny Schroeder, DOB [birth year], MRN [id number]  PCP: Patient, No Pcp Per (Inactive) Primary Cardiologist: Armanda Magic, MD Electrophysiologist: Sherryl Manges, MD   CC: Routine ICD follow-up  Johnny Schroeder is a 55 y.o. male seen today for Sherryl Manges, MD for post hospital follow up.    Seen in ED 8/8 with syncope. Device showed VT with ATP. Refused admission Returned to ED 8/9 and admitted through 8/11 with uncontrolled HTN in the setting of med non-compliance.   8/10 Labs Mg 1.6 CBC stable K 3.4 Cr 1.05 Glucose 1.05 , HgB A1c 6.6  Since discharge from hospital the patient reports doing well. He has some hip pain from where he fell when he had syncope, but is otherwise without complaint. He denies chest pain, palpitations, dyspnea, PND, orthopnea, nausea, vomiting, dizziness, further syncope, edema, weight gain, or early satiety. He has not had ICD shocks or any further ATP. He reports strict compliance.   Device History: BSci dual chamber ICD implanted 01/31/20, ([facility]), primary prevention    Past Medical History:  Diagnosis Date   CHF (congestive heart failure) (HCC)    COPD (chronic obstructive pulmonary disease) (HCC)    Diabetes mellitus without complication (HCC)    Hypertension    No past surgical history on file.  Current Outpatient Medications  Medication Sig Dispense Refill   acetaminophen (TYLENOL) 325 MG tablet Take 1 tablet (325 mg total) by mouth every 6 (six) hours as needed for moderate pain, mild pain, fever or headache. 30 tablet 0   amLODipine (NORVASC) 10 MG tablet Take 1 tablet (10 mg total) by mouth daily. 90 tablet 1   aspirin EC 81 MG tablet Take 81 mg by mouth daily.     atorvastatin (LIPITOR) 40 MG tablet Take 40 mg by mouth daily.     carvedilol (COREG) 12.5 MG tablet Take 3 tablets (37.5 mg total) by mouth 2 (two) times daily with a meal. 180 tablet 0   emtricitabine-tenofovir ([name])  200-300 MG tablet Take 1 tablet by mouth daily.     fluticasone (FLONASE) 50 MCG/ACT nasal spray Place 1 spray into both nostrils daily as needed for allergies or rhinitis.     guaifenesin (HUMIBID E) 400 MG TABS tablet Take 800 mg by mouth every 12 (twelve) hours as needed (congestion).     hydrochlorothiazide (HYDRODIURIL) 50 MG tablet Take 1 tablet (50 mg total) by mouth daily. 30 tablet 0   isosorbide mononitrate (IMDUR) 60 MG 24 hr tablet Take 1 tablet (60 mg total) by mouth daily. 90 tablet 3   losartan (COZAAR) 100 MG tablet Take 1 tablet (100 mg total) by mouth daily. 30 tablet 0   nicotine polacrilex (NICORETTE) 2 MG gum Take 1 each (2 mg total) by mouth as needed for smoking cessation. 100 tablet 0   No current facility-administered medications for this visit.    Allergies:   Lactose and Lisinopril   Social History: Social History   Socioeconomic History   Marital status: Divorced    Spouse name: Not on file   Number of children: Not on file   Years of education: Not on file   Highest education level: Not on file  Occupational History   Not on file  Tobacco Use   Smoking status: Every Day    Packs/day: 1.00    Types: Cigarettes   Smokeless tobacco: Never  Vaping Use   Vaping Use: Never used  Substance and Sexual Activity  Alcohol use: No   Drug use: Yes    Types: Marijuana   Sexual activity: Not on file  Other Topics Concern   Not on file  Social History Narrative   Not on file   Social Determinants of Health   Financial Resource Strain: Not on file  Food Insecurity: Not on file  Transportation Needs: Not on file  Physical Activity: Not on file  Stress: Not on file  Social Connections: Not on file  Intimate Partner Violence: Not on file    Family History: No family history on file.  Review of Systems: All other systems reviewed and are otherwise negative except as noted above.   Physical Exam: Vitals:   08/16/21 0849  BP: (!) 148/82  Pulse: 69   SpO2: 96%  Weight: 197 lb 6.4 oz (89.5 kg)  Height: 6\' 1"  (1.854 m)     GEN- The patient is well appearing, alert and oriented x 3 today.   HEENT: normocephalic, atraumatic; sclera clear, conjunctiva pink; hearing intact; oropharynx clear; neck supple, no JVP Lymph- no cervical lymphadenopathy Lungs- Clear to ausculation bilaterally, normal work of breathing.  No wheezes, rales, rhonchi Heart- Regular rate and rhythm, no murmurs, rubs or gallops, PMI not laterally displaced GI- soft, non-tender, non-distended, bowel sounds present, no hepatosplenomegaly Extremities- no clubbing or cyanosis. No edema; DP/PT/radial pulses 2+ bilaterally MS- no significant deformity or atrophy Skin- warm and dry, no rash or lesion; ICD pocket well healed Psych- euthymic mood, full affect Neuro- strength and sensation are intact  ICD interrogation- reviewed in detail today,  See PACEART report  EKG:  EKG is not ordered today.  Recent Labs: 08/07/2021: B Natriuretic Peptide 325.4 08/08/2021: ALT 24; BUN 17; Creatinine, Ser 1.05; Hemoglobin 13.6; Magnesium 1.6; Platelets 208; Potassium 3.4; Sodium 136   Wt Readings from Last 3 Encounters:  08/16/21 197 lb 6.4 oz (89.5 kg)  08/07/21 192 lb (87.1 kg)  03/16/21 176 lb (79.8 kg)     Other studies Reviewed: Additional studies/ records that were reviewed today include: Previous EP office notes   Assessment and Plan:  1. HCM s/p [employer] dual chamber ICD  Volume status stable today.   Stable on an appropriate medical regimen Normal ICD function See Pace Art Report No changes today.   2. HTN Continue amlodipine, coreg, HCTz, imdur, and losartan.    3. NSVT/ VT  With recent VT, ATP, and syncope.  Discussed importance of med compliance.  Reviewed [place]CD[place] guidelines of no driving for 6 months after appropriate ICD therapy.   4. DM2 Hgb A1c 6.6. Recommended PCP follow up. [facility] number previously given. He says he has a "list" he just needs to  call them.   Current medicines are reviewed at length with the patient today.   The patient does not have concerns regarding his medicines.  The following changes were made today:  none  Labs/ tests ordered today include:  Orders Placed This Encounter  Procedures   Comprehensive metabolic panel   Magnesium   Disposition:   Follow up with EP APP  2-3 months. Sooner with further issues. Stressed importance of med compliance.   03/18/21, PA-C  08/16/2021 9:42 AM  [facility] [address] [place] [phone] (office) [phone] (fax)

## 2021-08-16 ENCOUNTER — Encounter: Payer: Self-pay | Admitting: Student

## 2021-08-16 ENCOUNTER — Ambulatory Visit (INDEPENDENT_AMBULATORY_CARE_PROVIDER_SITE_OTHER): Payer: BC Managed Care – PPO | Admitting: Student

## 2021-08-16 ENCOUNTER — Other Ambulatory Visit: Payer: Self-pay

## 2021-08-16 VITALS — BP 148/82 | HR 69 | Ht 73.0 in | Wt 197.4 lb

## 2021-08-16 DIAGNOSIS — I422 Other hypertrophic cardiomyopathy: Secondary | ICD-10-CM | POA: Diagnosis not present

## 2021-08-16 DIAGNOSIS — I472 Ventricular tachycardia: Secondary | ICD-10-CM | POA: Diagnosis not present

## 2021-08-16 DIAGNOSIS — E119 Type 2 diabetes mellitus without complications: Secondary | ICD-10-CM

## 2021-08-16 DIAGNOSIS — I10 Essential (primary) hypertension: Secondary | ICD-10-CM | POA: Diagnosis not present

## 2021-08-16 DIAGNOSIS — I4729 Other ventricular tachycardia: Secondary | ICD-10-CM

## 2021-08-16 DIAGNOSIS — I5032 Chronic diastolic (congestive) heart failure: Secondary | ICD-10-CM

## 2021-08-16 DIAGNOSIS — Z95810 Presence of automatic (implantable) cardiac defibrillator: Secondary | ICD-10-CM | POA: Diagnosis not present

## 2021-08-16 LAB — CUP PACEART INCLINIC DEVICE CHECK
Date Time Interrogation Session: 20220818093943
HighPow Impedance: 56 Ohm
Implantable Lead Implant Date: 20210201
Implantable Lead Implant Date: 20210201
Implantable Lead Location: 753862
Implantable Lead Location: 753862
Implantable Lead Model: 273
Implantable Lead Model: 7841
Implantable Lead Serial Number: 1044519
Implantable Lead Serial Number: 109249
Implantable Pulse Generator Implant Date: 20210201
Lead Channel Impedance Value: 454 Ohm
Lead Channel Impedance Value: 714 Ohm
Lead Channel Pacing Threshold Amplitude: 0.7 V
Lead Channel Pacing Threshold Amplitude: 0.7 V
Lead Channel Pacing Threshold Pulse Width: 0.4 ms
Lead Channel Pacing Threshold Pulse Width: 0.4 ms
Lead Channel Sensing Intrinsic Amplitude: 19.1 mV
Lead Channel Sensing Intrinsic Amplitude: 4.7 mV
Lead Channel Setting Pacing Amplitude: 2 V
Lead Channel Setting Pacing Amplitude: 2 V
Lead Channel Setting Pacing Pulse Width: 0.4 ms
Lead Channel Setting Sensing Sensitivity: 0.6 mV
Pulse Gen Serial Number: 593625

## 2021-08-16 LAB — COMPREHENSIVE METABOLIC PANEL
ALT: 32 IU/L (ref 0–44)
AST: 29 IU/L (ref 0–40)
Albumin/Globulin Ratio: 1.3 (ref 1.2–2.2)
Albumin: 4 g/dL (ref 3.8–4.9)
Alkaline Phosphatase: 90 IU/L (ref 44–121)
BUN/Creatinine Ratio: 24 — ABNORMAL HIGH (ref 9–20)
BUN: 28 mg/dL — ABNORMAL HIGH (ref 6–24)
Bilirubin Total: 0.4 mg/dL (ref 0.0–1.2)
CO2: 24 mmol/L (ref 20–29)
Calcium: 9 mg/dL (ref 8.7–10.2)
Chloride: 100 mmol/L (ref 96–106)
Creatinine, Ser: 1.18 mg/dL (ref 0.76–1.27)
Globulin, Total: 3.2 g/dL (ref 1.5–4.5)
Glucose: 181 mg/dL — ABNORMAL HIGH (ref 65–99)
Potassium: 4 mmol/L (ref 3.5–5.2)
Sodium: 136 mmol/L (ref 134–144)
Total Protein: 7.2 g/dL (ref 6.0–8.5)
eGFR: 74 mL/min/{1.73_m2} (ref >59)

## 2021-08-16 LAB — MAGNESIUM: Magnesium: 1.8 mg/dL (ref 1.6–2.3)

## 2021-08-16 NOTE — Patient Instructions (Addendum)
Medication Instructions:  Your physician recommends that you continue on your current medications as directed. Please refer to the Current Medication list given to you today.  *If you need a refill on your cardiac medications before your next appointment, please call your pharmacy*   Lab Work: TODAY: CMET, Mg  If you have labs (blood work) drawn today and your tests are completely normal, you will receive your results only by: MyChart Message (if you have MyChart) OR A paper copy in the mail If you have any lab test that is abnormal or we need to change your treatment, we will call you to review the results.   Follow-Up: At [facility], you and your health needs are our priority.  As part of our continuing mission to provide you with exceptional heart care, we have created designated Provider Care Teams.  These Care Teams include your primary Cardiologist (physician) and Advanced Practice Providers (APPs -  Physician Assistants and Nurse Practitioners) who all work together to provide you with the care you need, when you need it.   Your next appointment:   10/11/2021 with Otilio Saber, PA-C

## 2021-08-17 ENCOUNTER — Other Ambulatory Visit: Payer: Self-pay

## 2021-08-17 MED ORDER — MAGNESIUM OXIDE 400 MG PO CAPS: 400.0000 mg | ORAL_CAPSULE | Freq: Every day | ORAL | 3 refills | Status: AC

## 2021-08-30 NOTE — Progress Notes (Signed)
Remote ICD transmission.   

## 2021-09-19 ENCOUNTER — Other Ambulatory Visit: Payer: Self-pay

## 2021-09-19 ENCOUNTER — Emergency Department (HOSPITAL_COMMUNITY)
Admission: EM | Admit: 2021-09-19 | Discharge: 2021-09-19 | Disposition: A | Discharge status: Home / Self Care | Payer: BC Managed Care – PPO | Attending: Emergency Medicine | Admitting: Emergency Medicine

## 2021-09-19 ENCOUNTER — Encounter (HOSPITAL_COMMUNITY): Payer: Self-pay

## 2021-09-19 ENCOUNTER — Emergency Department (HOSPITAL_COMMUNITY): Payer: BC Managed Care – PPO

## 2021-09-19 DIAGNOSIS — Z79899 Other long term (current) drug therapy: Secondary | ICD-10-CM | POA: Diagnosis not present

## 2021-09-19 DIAGNOSIS — Z7982 Long term (current) use of aspirin: Secondary | ICD-10-CM | POA: Insufficient documentation

## 2021-09-19 DIAGNOSIS — I509 Heart failure, unspecified: Secondary | ICD-10-CM | POA: Insufficient documentation

## 2021-09-19 DIAGNOSIS — R110 Nausea: Secondary | ICD-10-CM | POA: Insufficient documentation

## 2021-09-19 DIAGNOSIS — E119 Type 2 diabetes mellitus without complications: Secondary | ICD-10-CM | POA: Diagnosis not present

## 2021-09-19 DIAGNOSIS — Z95810 Presence of automatic (implantable) cardiac defibrillator: Secondary | ICD-10-CM | POA: Insufficient documentation

## 2021-09-19 DIAGNOSIS — K828 Other specified diseases of gallbladder: Secondary | ICD-10-CM | POA: Diagnosis not present

## 2021-09-19 DIAGNOSIS — F17210 Nicotine dependence, cigarettes, uncomplicated: Secondary | ICD-10-CM | POA: Insufficient documentation

## 2021-09-19 DIAGNOSIS — N289 Disorder of kidney and ureter, unspecified: Secondary | ICD-10-CM

## 2021-09-19 DIAGNOSIS — R630 Anorexia: Secondary | ICD-10-CM | POA: Insufficient documentation

## 2021-09-19 DIAGNOSIS — J449 Chronic obstructive pulmonary disease, unspecified: Secondary | ICD-10-CM | POA: Insufficient documentation

## 2021-09-19 DIAGNOSIS — Z20822 Contact with and (suspected) exposure to covid-19: Secondary | ICD-10-CM | POA: Diagnosis not present

## 2021-09-19 DIAGNOSIS — I110 Hypertensive heart disease with heart failure: Secondary | ICD-10-CM | POA: Diagnosis not present

## 2021-09-19 DIAGNOSIS — R1030 Lower abdominal pain, unspecified: Secondary | ICD-10-CM | POA: Insufficient documentation

## 2021-09-19 DIAGNOSIS — I700 Atherosclerosis of aorta: Secondary | ICD-10-CM | POA: Diagnosis not present

## 2021-09-19 DIAGNOSIS — N400 Enlarged prostate without lower urinary tract symptoms: Secondary | ICD-10-CM | POA: Diagnosis not present

## 2021-09-19 DIAGNOSIS — Z9049 Acquired absence of other specified parts of digestive tract: Secondary | ICD-10-CM | POA: Diagnosis not present

## 2021-09-19 LAB — CBC WITH DIFFERENTIAL/PLATELET
Abs Immature Granulocytes: 0.01 10*3/uL (ref 0.00–0.07)
Basophils Absolute: 0 10*3/uL (ref 0.0–0.1)
Basophils Relative: 0 %
Eosinophils Absolute: 0 10*3/uL (ref 0.0–0.5)
Eosinophils Relative: 1 %
HCT: 38.4 % — ABNORMAL LOW (ref 39.0–52.0)
Hemoglobin: 13.1 g/dL (ref 13.0–17.0)
Immature Granulocytes: 0 %
Lymphocytes Relative: 42 %
Lymphs Abs: 2.8 10*3/uL (ref 0.7–4.0)
MCH: 32.1 pg (ref 26.0–34.0)
MCHC: 34.1 g/dL (ref 30.0–36.0)
MCV: 94.1 fL (ref 80.0–100.0)
Monocytes Absolute: 0.5 10*3/uL (ref 0.1–1.0)
Monocytes Relative: 7 %
Neutro Abs: 3.3 10*3/uL (ref 1.7–7.7)
Neutrophils Relative %: 50 %
Platelets: 230 10*3/uL (ref 150–400)
RBC: 4.08 MIL/uL — ABNORMAL LOW (ref 4.22–5.81)
RDW: 12.5 % (ref 11.5–15.5)
WBC: 6.6 10*3/uL (ref 4.0–10.5)
nRBC: 0 % (ref 0.0–0.2)

## 2021-09-19 LAB — COMPREHENSIVE METABOLIC PANEL
ALT: 18 U/L (ref 0–44)
AST: 24 U/L (ref 15–41)
Albumin: 3.6 g/dL (ref 3.5–5.0)
Alkaline Phosphatase: 66 U/L (ref 38–126)
Anion gap: 12 (ref 5–15)
BUN: 22 mg/dL — ABNORMAL HIGH (ref 6–20)
CO2: 23 mmol/L (ref 22–32)
Calcium: 9.3 mg/dL (ref 8.9–10.3)
Chloride: 97 mmol/L — ABNORMAL LOW (ref 98–111)
Creatinine, Ser: 1.73 mg/dL — ABNORMAL HIGH (ref 0.61–1.24)
GFR, Estimated: 47 mL/min — ABNORMAL LOW (ref >60)
Glucose, Bld: 387 mg/dL — ABNORMAL HIGH (ref 70–99)
Potassium: 3.5 mmol/L (ref 3.5–5.1)
Sodium: 132 mmol/L — ABNORMAL LOW (ref 135–145)
Total Bilirubin: 0.6 mg/dL (ref 0.3–1.2)
Total Protein: 7.2 g/dL (ref 6.5–8.1)

## 2021-09-19 LAB — URINALYSIS, MICROSCOPIC (REFLEX)
Bacteria, UA: NONE SEEN
RBC / HPF: NONE SEEN RBC/hpf (ref 0–5)

## 2021-09-19 LAB — URINALYSIS, ROUTINE W REFLEX MICROSCOPIC
Bilirubin Urine: NEGATIVE
Glucose, UA: NEGATIVE mg/dL
Ketones, ur: NEGATIVE mg/dL
Leukocytes,Ua: NEGATIVE
Nitrite: NEGATIVE
Protein, ur: NEGATIVE mg/dL
Specific Gravity, Urine: 1.01 (ref 1.005–1.030)
pH: 6 (ref 5.0–8.0)

## 2021-09-19 LAB — RESP PANEL BY RT-PCR (FLU A&B, COVID) ARPGX2
Influenza A by PCR: NEGATIVE
Influenza B by PCR: NEGATIVE
SARS Coronavirus 2 by RT PCR: NEGATIVE

## 2021-09-19 MED ORDER — SODIUM CHLORIDE 0.9 % IV BOLUS
1000.0000 mL | Freq: Once | INTRAVENOUS | Status: AC
  Administered 2021-09-19: 1000 mL via INTRAVENOUS

## 2021-09-19 MED ORDER — ONDANSETRON HCL 4 MG/2ML IJ SOLN
4.0000 mg | Freq: Once | INTRAMUSCULAR | Status: AC
  Administered 2021-09-19: 4 mg via INTRAVENOUS
  Filled 2021-09-19: qty 2

## 2021-09-19 MED ORDER — ONDANSETRON 4 MG PO TBDP: 4.0000 mg | ORAL_TABLET | Freq: Three times a day (TID) | ORAL | 0 refills | Status: AC | PRN

## 2021-09-19 NOTE — ED Notes (Signed)
Called pt 1x for vitals 

## 2021-09-19 NOTE — Discharge Instructions (Signed)
As discussed, your evaluation today has been largely reassuring.  But, it is important that you monitor your condition carefully, and do not hesitate to return to the ED if you develop new, or concerning changes in your condition. ? ?Otherwise, please follow-up with your physician for appropriate ongoing care. ? ?

## 2021-09-19 NOTE — ED Provider Notes (Signed)
I have personally evaluated this patient, he is feeling better, he has not been vomiting at all, his labs show that he had some renal insufficiency and thus he was given IV fluids.  There is no urinary tract infection and the CT scan confirms that there was no signs of kidney stone obstructive urinary disease, there was what appeared to be some small calcifications in the gallbladder, he can be referred outpatient but does not have any abdominal tenderness at this time.  The patient is very stable for discharge and agreeable to the plan with an outpatient oral antiemetic.   Eber Hong, MD 09/19/21 [phone]

## 2021-09-19 NOTE — ED Notes (Signed)
Patient told this tech he was going to step outside for a minute.

## 2021-09-19 NOTE — ED Triage Notes (Signed)
Drove self to ED c/o abd pain with nausea that started earlier today. Abd pain lower rt side dull ache. C/o feeling hot and sweaty and body aches.

## 2021-09-19 NOTE — ED Provider Notes (Signed)
Emergency Medicine Provider Triage Evaluation Note  Johnny Schroeder , a 55 y.o. male  was evaluated in triage.  Pt complains of nausea.  The patient reports nausea, lower abdominal pain, subjective fever and chills, and chest congestion, onset tonight.  No chest pain, shortness of breath, vomiting, diarrhea, penile or testicular pain or swelling, dysuria, or hematuria.  Abdominal pain has been improving since onset.  Characterizes pain as aching.  No known aggravating or alleviating factors.  Review of Systems  Positive: Abdominal pain, nausea, chills, chest congestion Negative: Chest pain, shortness of breath, vomiting, diarrhea, penile or testicular pain or swelling, dysuria, hematuria  Physical Exam  BP 133/67 (BP Location: Left Arm)   Pulse (!) 102   Temp 98.7 F (37.1 C) (Oral)   Resp 15   SpO2 96%  Gen:   Awake, no distress   Resp:  Normal effort  MSK:   Moves extremities without difficulty  Other:  Abdomen soft, nontender, nondistended.  Medical Decision Making  Medically screening exam initiated at 1:54 AM.  Appropriate orders placed.  Johnny Schroeder was informed that the remainder of the evaluation will be completed by another provider, this initial triage assessment does not replace that evaluation, and the importance of remaining in the ED until their evaluation is complete.  Labs have been ordered.  He will require further work-up and evaluation in the emergency department.   Frederik Pear A, PA-C 09/19/21 0155    Glynn Octave, MD 09/19/21 [phone]

## 2021-09-19 NOTE — ED Notes (Signed)
Patient transported to CT 

## 2021-09-19 NOTE — ED Provider Notes (Signed)
[facility] EMERGENCY DEPARTMENT Provider Note   CSN: [id number] Arrival date & time: 09/19/21  0054     History Chief Complaint  Patient presents with   Nausea   Abdominal Pain    Johnny Schroeder is a 55 y.o. male.  HPI Patient with a history of CHF, ICD placement, hypertension, but no history of abdominal surgery presents with 2 days of nausea, anorexia.  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  2 days ago he noticed mild suprapubic discomfort.  This has resolved entirely and he has no abdominal pain.  However, the patient has persistent anorexia, nausea.  He had 1 episode of loose stool, but no persistent diarrhea.  No fever, chills, chest pain, dyspnea.  No medication taken for relief.    Past Medical History:  Diagnosis Date   CHF (congestive heart failure) (HCC)    COPD (chronic obstructive pulmonary disease) (HCC)    Diabetes mellitus without complication (HCC)    Hypertension     Patient Active Problem List   Diagnosis Date Noted   Hypertensive urgency, malignant 08/07/2021   ICD (implantable cardioverter-defibrillator) in place 02/17/2020   Hypertrophic cardiomyopathy (HCC) 02/14/2020   Hypertensive urgency 01/27/2020   Essential hypertension 10/26/2019   Tobacco abuse 06/30/2013   Anemia, deficiency 06/24/2013   Diabetes type 2, controlled (HCC) 06/30/2012   History of CHF (congestive heart failure) 10/18/2011   Obesity, Class II, BMI 35-39.9, with comorbidity 12/19/2010    History reviewed. No pertinent surgical history.     History reviewed. No pertinent family history.  Social History   Tobacco Use   Smoking status: Every Day    Packs/day: 1.00    Types: Cigarettes   Smokeless tobacco: Never  Vaping Use   Vaping Use: Never used  Substance Use Topics   Alcohol use: No   Drug use: Yes    Types: Marijuana    Home Medications Prior to Admission medications   Medication Sig Start Date End  Date Taking? Authorizing Provider  acetaminophen (TYLENOL) 325 MG tablet Take 1 tablet (325 mg total) by mouth every 6 (six) hours as needed for moderate pain, mild pain, fever or headache. 08/09/21   Azucena Fallen, MD  amLODipine (NORVASC) 10 MG tablet Take 1 tablet (10 mg total) by mouth daily. 08/10/21   Duke Salvia, MD  aspirin EC 81 MG tablet Take 81 mg by mouth daily.    [provider]  atorvastatin (LIPITOR) 40 MG tablet Take 40 mg by mouth daily. 03/03/20   [provider]  carvedilol (COREG) 12.5 MG tablet Take 3 tablets (37.5 mg total) by mouth 2 (two) times daily with a meal. 08/09/21   Azucena Fallen, MD  emtricitabine-tenofovir ([name]) 200-300 MG tablet Take 1 tablet by mouth daily. 06/02/20   [provider]  fluticasone (FLONASE) 50 MCG/ACT nasal spray Place 1 spray into both nostrils daily as needed for allergies or rhinitis.    [provider]  guaifenesin (HUMIBID E) 400 MG TABS tablet Take 800 mg by mouth every 12 (twelve) hours as needed (congestion).    [provider]  hydrochlorothiazide (HYDRODIURIL) 50 MG tablet Take 1 tablet (50 mg total) by mouth daily. 08/09/21   Azucena Fallen, MD  isosorbide mononitrate (IMDUR) 60 MG 24 hr tablet Take 1 tablet (60 mg total) by mouth daily. 03/16/21 03/16/22  Graciella Freer, PA-C  losartan (COZAAR) 100 MG tablet Take 1 tablet (100  mg total) by mouth daily. 08/09/21   Azucena Fallen, MD  Magnesium Oxide 400 MG CAPS Take 1 capsule (400 mg total) by mouth daily. 08/17/21   Graciella Freer, PA-C  nicotine polacrilex (NICORETTE) 2 MG gum Take 1 each (2 mg total) by mouth as needed for smoking cessation. 08/09/21   Azucena Fallen, MD    Allergies    Lactose and Lisinopril  Review of Systems   Review of Systems  Constitutional:        Per HPI, otherwise negative  HENT:         Per HPI, otherwise negative  Respiratory:         Per HPI, otherwise  negative  Cardiovascular:        Per HPI, otherwise negative  Gastrointestinal:  Positive for diarrhea (Loose stool x1) and nausea. Negative for vomiting.  Endocrine:       Negative aside from HPI  Genitourinary:        Neg aside from HPI   Musculoskeletal:        Per HPI, otherwise negative  Skin: Negative.   Neurological:  Negative for syncope.   Physical Exam Updated Vital Signs BP 127/72 (BP Location: Left Arm)   Pulse 93   Temp 98.5 F (36.9 C) (Oral)   Resp 18   Ht 6\' 1"  (1.854 m)   Wt 81.6 kg   SpO2 99%   BMI 23.75 kg/m   Physical Exam Vitals and nursing note reviewed.  Constitutional:      General: He is not in acute distress.    Appearance: He is well-developed.  HENT:     Head: Normocephalic and atraumatic.  Eyes:     Conjunctiva/sclera: Conjunctivae normal.  Cardiovascular:     Rate and Rhythm: Normal rate and regular rhythm.  Pulmonary:     Effort: Pulmonary effort is normal. No respiratory distress.     Breath sounds: No stridor.  Abdominal:     General: There is no distension.     Tenderness: There is no abdominal tenderness. There is no guarding or rebound. Negative signs include Murphy's sign and McBurney's sign.  Skin:    General: Skin is warm and dry.  Neurological:     Mental Status: He is alert and oriented to person, place, and time.    ED Results / Procedures / Treatments   Labs (all labs ordered are listed, but only abnormal results are displayed) Labs Reviewed  CBC WITH DIFFERENTIAL/PLATELET - Abnormal; Notable for the following components:      Result Value   RBC 4.08 (*)    HCT 38.4 (*)    All other components within normal limits  COMPREHENSIVE METABOLIC PANEL - Abnormal; Notable for the following components:   Sodium 132 (*)    Chloride 97 (*)    Glucose, Bld 387 (*)    BUN 22 (*)    Creatinine, Ser 1.73 (*)    GFR, Estimated 47 (*)    All other components within normal limits  RESP PANEL BY RT-PCR (FLU A&B, COVID) ARPGX2   URINALYSIS, ROUTINE W REFLEX MICROSCOPIC    EKG None  Radiology No results found.  Procedures Procedures   Medications Ordered in ED Medications  sodium chloride 0.9 % bolus 1,000 mL (1,000 mLs Intravenous New Bag/Given 09/19/21 1559)  ondansetron (ZOFRAN) injection 4 mg (4 mg Intravenous Given 09/19/21 1550)    ED Course  I have reviewed the triage vital signs and the nursing notes.  Pertinent  labs & imaging results that were available during my care of the patient were reviewed by me and considered in my medical decision making (see chart for details).   4:05 PM Initial labs notable for elevated creatinine, concerning for dehydration.  BUN elevated. Now in discussing his history further, patient notes that in the distant past he has had kidney stones.  Given description of suprapubic pain previously, CT renal scan is pending.    MDM Rules/Calculators/A&P Adult male, multiple medical issues, but generally well presents with 2 days of nausea, anorexia, 1 episode loose stool.  He is afebrile, awake, alert, has no ongoing abdominal pain.  Some suspicion for gastrointestinal disorder, absence of abdominal pain currently, rebound, guarding all reassuring, low suspicion for acute abdomen, however. With a history of kidney stone in the distant past, this is an additional consideration, and CT scan is pending on signout.   Final Clinical Impression(s) / ED Diagnoses Final diagnoses:  Nausea     Gerhard Munch, MD 09/19/21 1614

## 2021-09-19 NOTE — ED Notes (Signed)
Patient said he was going to step outside for a minute.

## 2021-09-24 ENCOUNTER — Emergency Department (HOSPITAL_COMMUNITY): Payer: BC Managed Care – PPO

## 2021-09-24 ENCOUNTER — Other Ambulatory Visit: Payer: Self-pay

## 2021-09-24 ENCOUNTER — Encounter (HOSPITAL_COMMUNITY): Payer: Self-pay | Admitting: Emergency Medicine

## 2021-09-24 ENCOUNTER — Emergency Department (HOSPITAL_COMMUNITY)
Admission: EM | Admit: 2021-09-24 | Discharge: 2021-09-25 | Disposition: A | Discharge status: Home / Self Care | Payer: BC Managed Care – PPO | Attending: Emergency Medicine | Admitting: Emergency Medicine

## 2021-09-24 DIAGNOSIS — R059 Cough, unspecified: Secondary | ICD-10-CM

## 2021-09-24 DIAGNOSIS — J069 Acute upper respiratory infection, unspecified: Secondary | ICD-10-CM | POA: Insufficient documentation

## 2021-09-24 DIAGNOSIS — E119 Type 2 diabetes mellitus without complications: Secondary | ICD-10-CM | POA: Insufficient documentation

## 2021-09-24 DIAGNOSIS — I509 Heart failure, unspecified: Secondary | ICD-10-CM | POA: Insufficient documentation

## 2021-09-24 DIAGNOSIS — I700 Atherosclerosis of aorta: Secondary | ICD-10-CM | POA: Diagnosis not present

## 2021-09-24 DIAGNOSIS — R509 Fever, unspecified: Secondary | ICD-10-CM | POA: Diagnosis not present

## 2021-09-24 DIAGNOSIS — Z7951 Long term (current) use of inhaled steroids: Secondary | ICD-10-CM | POA: Insufficient documentation

## 2021-09-24 DIAGNOSIS — J449 Chronic obstructive pulmonary disease, unspecified: Secondary | ICD-10-CM | POA: Diagnosis not present

## 2021-09-24 DIAGNOSIS — Z7982 Long term (current) use of aspirin: Secondary | ICD-10-CM | POA: Insufficient documentation

## 2021-09-24 DIAGNOSIS — Z20822 Contact with and (suspected) exposure to covid-19: Secondary | ICD-10-CM | POA: Insufficient documentation

## 2021-09-24 DIAGNOSIS — I110 Hypertensive heart disease with heart failure: Secondary | ICD-10-CM | POA: Diagnosis not present

## 2021-09-24 DIAGNOSIS — Z79899 Other long term (current) drug therapy: Secondary | ICD-10-CM | POA: Diagnosis not present

## 2021-09-24 DIAGNOSIS — I10 Essential (primary) hypertension: Secondary | ICD-10-CM | POA: Diagnosis not present

## 2021-09-24 DIAGNOSIS — F17210 Nicotine dependence, cigarettes, uncomplicated: Secondary | ICD-10-CM | POA: Insufficient documentation

## 2021-09-24 DIAGNOSIS — J90 Pleural effusion, not elsewhere classified: Secondary | ICD-10-CM | POA: Diagnosis not present

## 2021-09-24 LAB — RESP PANEL BY RT-PCR (FLU A&B, COVID) ARPGX2
Influenza A by PCR: NEGATIVE
Influenza B by PCR: NEGATIVE
SARS Coronavirus 2 by RT PCR: NEGATIVE

## 2021-09-24 NOTE — ED Triage Notes (Addendum)
Patient complains of sinus congestion, cough, and fever that started yesterday. Patients denies nausea, denies diarrhea but reports that sometimes he coughs repeatedly and that causes him to vomit. Patient alert, oriented, ambulatory, and in no apparent distress at this time.  History of hypertension, BP 181/76 in triage, reports he is out of his antihypertensive medications but has an appointment this Thursday to have medications represcribed and refilled.

## 2021-09-24 NOTE — ED Provider Notes (Signed)
Emergency Medicine Provider Triage Evaluation Note  Johnny Schroeder , a 55 y.o. male  was evaluated in triage.  Pt complains of congestion, rhinorrhea and cough.  Review of Systems  Positive: Congestion, rhinorrhea, cough Negative: cp  Physical Exam  There were no vitals taken for this visit. Gen:   Awake, no distress   Resp:  Normal effort  MSK:   Moves extremities without difficulty   Medical Decision Making  Medically screening exam initiated at 3:21 PM.  Appropriate orders placed.  Damean Poffenberger was informed that the remainder of the evaluation will be completed by another provider, this initial triage assessment does not replace that evaluation, and the importance of remaining in the ED until their evaluation is complete.     Karrie Meres, PA-C 09/24/21 1522    Milagros Loll, MD 09/25/21 1414

## 2021-09-24 NOTE — ED Notes (Signed)
Patient  called for triage, no answer  x1 

## 2021-09-25 ENCOUNTER — Emergency Department (HOSPITAL_COMMUNITY): Payer: BC Managed Care – PPO

## 2021-09-25 DIAGNOSIS — I10 Essential (primary) hypertension: Secondary | ICD-10-CM | POA: Diagnosis not present

## 2021-09-25 DIAGNOSIS — J069 Acute upper respiratory infection, unspecified: Secondary | ICD-10-CM | POA: Diagnosis not present

## 2021-09-25 DIAGNOSIS — I700 Atherosclerosis of aorta: Secondary | ICD-10-CM | POA: Diagnosis not present

## 2021-09-25 DIAGNOSIS — J90 Pleural effusion, not elsewhere classified: Secondary | ICD-10-CM | POA: Diagnosis not present

## 2021-09-25 LAB — BASIC METABOLIC PANEL
Anion gap: 8 (ref 5–15)
BUN: 13 mg/dL (ref 6–20)
CO2: 25 mmol/L (ref 22–32)
Calcium: 9 mg/dL (ref 8.9–10.3)
Chloride: 102 mmol/L (ref 98–111)
Creatinine, Ser: 1.05 mg/dL (ref 0.61–1.24)
GFR, Estimated: >60 mL/min (ref >60)
Glucose, Bld: 235 mg/dL — ABNORMAL HIGH (ref 70–99)
Potassium: 3.5 mmol/L (ref 3.5–5.1)
Sodium: 135 mmol/L (ref 135–145)

## 2021-09-25 LAB — CBC WITH DIFFERENTIAL/PLATELET
Abs Immature Granulocytes: 0.01 10*3/uL (ref 0.00–0.07)
Basophils Absolute: 0 10*3/uL (ref 0.0–0.1)
Basophils Relative: 0 %
Eosinophils Absolute: 0.1 10*3/uL (ref 0.0–0.5)
Eosinophils Relative: 1 %
HCT: 39.2 % (ref 39.0–52.0)
Hemoglobin: 13.3 g/dL (ref 13.0–17.0)
Immature Granulocytes: 0 %
Lymphocytes Relative: 20 %
Lymphs Abs: 1.1 10*3/uL (ref 0.7–4.0)
MCH: 32.3 pg (ref 26.0–34.0)
MCHC: 33.9 g/dL (ref 30.0–36.0)
MCV: 95.1 fL (ref 80.0–100.0)
Monocytes Absolute: 0.4 10*3/uL (ref 0.1–1.0)
Monocytes Relative: 6 %
Neutro Abs: 4.1 10*3/uL (ref 1.7–7.7)
Neutrophils Relative %: 73 %
Platelets: 195 10*3/uL (ref 150–400)
RBC: 4.12 MIL/uL — ABNORMAL LOW (ref 4.22–5.81)
RDW: 12.9 % (ref 11.5–15.5)
WBC: 5.6 10*3/uL (ref 4.0–10.5)
nRBC: 0 % (ref 0.0–0.2)

## 2021-09-25 MED ORDER — ALBUTEROL SULFATE HFA 108 (90 BASE) MCG/ACT IN AERS
2.0000 | INHALATION_SPRAY | Freq: Once | RESPIRATORY_TRACT | Status: AC
  Administered 2021-09-25: 2 via RESPIRATORY_TRACT
  Filled 2021-09-25: qty 6.7

## 2021-09-25 MED ORDER — DEXAMETHASONE 4 MG PO TABS
10.0000 mg | ORAL_TABLET | Freq: Once | ORAL | Status: AC
  Administered 2021-09-25: 10 mg via ORAL
  Filled 2021-09-25: qty 3

## 2021-09-25 MED ORDER — IOHEXOL 350 MG/ML SOLN
75.0000 mL | Freq: Once | INTRAVENOUS | Status: AC | PRN
  Administered 2021-09-25: 75 mL via INTRAVENOUS

## 2021-09-25 NOTE — ED Notes (Signed)
RN reviewed discharge instructions with pt. Pt verbalized understanding and had no further questions. VSS upon discharge.  

## 2021-09-25 NOTE — ED Provider Notes (Signed)
[facility] EMERGENCY DEPARTMENT Provider Note   CSN: [id number] Arrival date & time: 09/24/21  1422     History Chief Complaint  Patient presents with   Cough    Johnny Schroeder is a 55 y.o. male.  HPI     This is a 56 year old male with history of CHF, COPD, diabetes, hypertension who presents with congestion, cough, chills.  Patient reports several days of symptoms.  He believes he has had fever but has not documented a fever at home.  He states he has had multiple episodes of nonproductive cough and posttussive emesis.  He reports chest congestion but no chest pain.  Also reports some sinus congestion.  No nausea, vomiting, diarrhea.  No known sick contacts or COVID exposures.  Patient reports intact sense of taste and smell.  He is a current smoker.  Past Medical History:  Diagnosis Date   CHF (congestive heart failure) (HCC)    COPD (chronic obstructive pulmonary disease) (HCC)    Diabetes mellitus without complication (HCC)    Hypertension     Patient Active Problem List   Diagnosis Date Noted   Hypertensive urgency, malignant 08/07/2021   ICD (implantable cardioverter-defibrillator) in place 02/17/2020   Hypertrophic cardiomyopathy (HCC) 02/14/2020   Hypertensive urgency 01/27/2020   Essential hypertension 10/26/2019   Tobacco abuse 06/30/2013   Anemia, deficiency 06/24/2013   Diabetes type 2, controlled (HCC) 06/30/2012   History of CHF (congestive heart failure) 10/18/2011   Obesity, Class II, BMI 35-39.9, with comorbidity 12/19/2010    History reviewed. No pertinent surgical history.     No family history on file.  Social History   Tobacco Use   Smoking status: Every Day    Packs/day: 1.00    Types: Cigarettes   Smokeless tobacco: Never  Vaping Use   Vaping Use: Never used  Substance Use Topics   Alcohol use: No   Drug use: Yes    Types: Marijuana    Home Medications Prior to Admission medications   Medication Sig Start  Date End Date Taking? Authorizing Provider  acetaminophen (TYLENOL) 325 MG tablet Take 1 tablet (325 mg total) by mouth every 6 (six) hours as needed for moderate pain, mild pain, fever or headache. 08/09/21   Azucena Fallen, MD  amLODipine (NORVASC) 10 MG tablet Take 1 tablet (10 mg total) by mouth daily. 08/10/21   Duke Salvia, MD  aspirin EC 81 MG tablet Take 81 mg by mouth daily.    [provider]  atorvastatin (LIPITOR) 40 MG tablet Take 40 mg by mouth daily. 03/03/20   [provider]  carvedilol (COREG) 12.5 MG tablet Take 3 tablets (37.5 mg total) by mouth 2 (two) times daily with a meal. 08/09/21   Azucena Fallen, MD  emtricitabine-tenofovir ([name]) 200-300 MG tablet Take 1 tablet by mouth daily. 06/02/20   [provider]  fluticasone (FLONASE) 50 MCG/ACT nasal spray Place 1 spray into both nostrils daily as needed for allergies or rhinitis.    [provider]  guaifenesin (HUMIBID E) 400 MG TABS tablet Take 800 mg by mouth every 12 (twelve) hours as needed (congestion).    [provider]  hydrochlorothiazide (HYDRODIURIL) 50 MG tablet Take 1 tablet (50 mg total) by mouth daily. 08/09/21   Azucena Fallen, MD  isosorbide mononitrate (IMDUR) 60 MG 24 hr tablet Take 1 tablet (60 mg total) by mouth daily. 03/16/21 03/16/22  Graciella Freer, PA-C  losartan (COZAAR) 100 MG tablet  Take 1 tablet (100 mg total) by mouth daily. 08/09/21   Azucena Fallen, MD  Magnesium Oxide 400 MG CAPS Take 1 capsule (400 mg total) by mouth daily. 08/17/21   Graciella Freer, PA-C  nicotine polacrilex (NICORETTE) 2 MG gum Take 1 each (2 mg total) by mouth as needed for smoking cessation. 08/09/21   Azucena Fallen, MD  ondansetron (ZOFRAN ODT) 4 MG disintegrating tablet Take 1 tablet (4 mg total) by mouth every 8 (eight) hours as needed for nausea or vomiting. 09/19/21   Gerhard Munch, MD    Allergies    Lactose and  Lisinopril  Review of Systems   Review of Systems  Constitutional:  Positive for chills. Negative for fever.  HENT:  Positive for congestion.   Respiratory:  Positive for cough and shortness of breath. Negative for chest tightness.   Cardiovascular:  Negative for chest pain.  Gastrointestinal:  Negative for abdominal pain, nausea and vomiting.  Genitourinary:  Negative for dysuria.  All other systems reviewed and are negative.  Physical Exam Updated Vital Signs BP (!) 202/90   Pulse 93   Temp 98.9 F (37.2 C)   Resp (!) 27   SpO2 96%   Physical Exam Vitals and nursing note reviewed.  Constitutional:      Appearance: He is well-developed. He is not ill-appearing.  HENT:     Head: Normocephalic and atraumatic.     Nose: Nose normal.     Mouth/Throat:     Mouth: Mucous membranes are moist.  Eyes:     Pupils: Pupils are equal, round, and reactive to light.  Neck:     Comments: Old tracheostomy scar noted Cardiovascular:     Rate and Rhythm: Normal rate and regular rhythm.     Heart sounds: Normal heart sounds. No murmur heard. Pulmonary:     Effort: Pulmonary effort is normal. No respiratory distress.     Breath sounds: Rhonchi present. No wheezing.     Comments: Rhonchorous breath sounds bilaterally, no respiratory distress, no wheezing Abdominal:     General: Bowel sounds are normal.     Palpations: Abdomen is soft.     Tenderness: There is no abdominal tenderness. There is no rebound.  Musculoskeletal:     Cervical back: Neck supple.     Right lower leg: No edema.     Left lower leg: No edema.  Lymphadenopathy:     Cervical: No cervical adenopathy.  Skin:    General: Skin is warm and dry.  Neurological:     Mental Status: He is alert and oriented to person, place, and time.  Psychiatric:        Mood and Affect: Mood normal.    ED Results / Procedures / Treatments   Labs (all labs ordered are listed, but only abnormal results are displayed) Labs Reviewed   CBC WITH DIFFERENTIAL/PLATELET - Abnormal; Notable for the following components:      Result Value   RBC 4.12 (*)    All other components within normal limits  BASIC METABOLIC PANEL - Abnormal; Notable for the following components:   Glucose, Bld 235 (*)    All other components within normal limits  RESP PANEL BY RT-PCR (FLU A&B, COVID) [id number]    EKG EKG Interpretation  Date/Time:  Tuesday September 25 2021 01:52:22 EDT Ventricular Rate:  87 PR Interval:  200 QRS Duration: 163 QT Interval:  443 QTC Calculation: 533 R Axis:   -87 Text Interpretation: Sinus rhythm Biatrial  enlargement Right bundle branch block Consider left ventricular hypertrophy No significant change since last tracing Confirmed by Ross Marcus ([id number]) on 09/25/2021 1:54:25 AM  Radiology DG Chest 1 View  Result Date: 09/24/2021 CLINICAL DATA:  Sinus congestion, cough and fever EXAM: CHEST  1 VIEW COMPARISON:  08/07/2021 CT abdomen 09/19/2021 FINDINGS: LEFT-sided pacemaker overlies normal cardiac silhouette. There is subtle airspace disease in the RIGHT lower lobe not changed from prior. Mild blunting of the LEFT costophrenic angle. Pneumothorax. Upper lungs clear. IMPRESSION: Chronic RIGHT lower lobe opacity. Findings correlate with scarring and loculated RIGHT pleural effusion on comparison CT. Consider nonemergent CT of thorax to fully evaluate lungs. Electronically Signed   By: Genevive Bi M.D.   On: 09/24/2021 16:37   CT Chest W Contrast  Result Date: 09/25/2021 CLINICAL DATA:  Abnormal x-ray with pleural effusion EXAM: CT CHEST WITH CONTRAST TECHNIQUE: Multidetector CT imaging of the chest was performed during intravenous contrast administration. CONTRAST:  80mL OMNIPAQUE IOHEXOL 350 MG/ML SOLN COMPARISON:  Radiography from yesterday FINDINGS: Cardiovascular: Hypertrophic appearance of the left ventricular wall. No pericardial effusion. Dual-chamber pacer/defibrillator leads from the left. Coronary  calcification. Atheromatous calcification of the aorta. Mediastinum/Nodes: Non thickened mediastinal lymph nodes. Architectural distortion from presumed prior tracheostomy. Lungs/Pleura: Chronic small thick walled pleural effusion at the right posterior costophrenic sulcus with calcification in the wall. Adjacent subpleural reticulation and bandlike opacity is attributed to scarring. Mild scarring or atelectasis in the subpleural left upper lobe. Band of pleural scarring and calcification at the left apex. There is no edema, consolidation, effusion, or pneumothorax. Upper Abdomen: Cholecystectomy Musculoskeletal: No acute or aggressive finding IMPRESSION: 1. No acute finding. 2. Small, chronic and loculated right pleural effusion with pulmonary scarring. 3. Thickened left ventricle. 4. Aortic Atherosclerosis (ICD10-I70.0).  Coronary atherosclerosis. Electronically Signed   By: Tiburcio Pea M.D.   On: 09/25/2021 04:29    Procedures Procedures   Medications Ordered in ED Medications  iohexol (OMNIPAQUE) 350 MG/ML injection 75 mL (75 mLs Intravenous Contrast Given 09/25/21 0420)  albuterol (VENTOLIN HFA) 108 (90 Base) MCG/ACT inhaler 2 puff (2 puffs Inhalation Given 09/25/21 0441)  dexamethasone (DECADRON) tablet 10 mg (10 mg Oral Given 09/25/21 9562)    ED Course  I have reviewed the triage vital signs and the nursing notes.  Pertinent labs & imaging results that were available during my care of the patient were reviewed by me and considered in my medical decision making (see chart for details).    MDM Rules/Calculators/A&P                           Patient presents with cough and congestion.  He is nontoxic-appearing.  Vital signs notable for elevated blood pressure.  Range from 150s to 190s systolic.  Otherwise he is not in any respiratory distress.  O2 sats 96 200%.  He has some rales bilaterally.  Reports chills without documented fevers.  Considerations include but not limited to, viral  etiology such as COVID-19 or influenza, pneumonia, COPD exacerbation.  Chest x-ray shows a right lower lung opacity.  He does have chronic pleural effusion unchanged.  COVID and influenza testing are negative.  I did obtain a chest CT to better characterize his lungs.  Chest CT shows chronic pleural effusion.  Patient was given inhaler and Decadron to cover for COPD exacerbation.  He is remained hemodynamically stable and in no respiratory distress.  Likely viral URI causing his cough and symptoms.  Recommend supportive  measures at home.  After history, exam, and medical workup I feel the patient has been appropriately medically screened and is safe for discharge home. Pertinent diagnoses were discussed with the patient. Patient was given return precautions.  Final Clinical Impression(s) / ED Diagnoses Final diagnoses:  Viral URI with cough  Primary hypertension    Rx / DC Orders ED Discharge Orders     None        Anastasya Jewell, Mayer Masker, MD 09/25/21 [phone]

## 2021-09-25 NOTE — Discharge Instructions (Addendum)
You were seen today for cough and shortness of breath.  Your work-up shows chronic lung changes.  Given your smoking, make sure to have use your inhaler to help with coughing.  This is likely an upper respiratory infection.  You were noted to be hypertensive.  It is very important that you take your blood pressure medications as prescribed.  Avoid taking over-the-counter medications that can worsen blood pressure issues such as Sudafed.

## 2021-10-10 NOTE — Progress Notes (Signed)
Electrophysiology Office Note Date: 10/10/2021  ID:  Johnny Schroeder, DOB 07-07-1966, MRN [id number]  PCP: Patient, No Pcp Per (Inactive) Primary Cardiologist: Armanda Magic, MD Electrophysiologist: Sherryl Manges, MD   CC: Routine ICD follow-up  Johnny Schroeder is a 55 y.o. male seen today for Sherryl Manges, MD for routine electrophysiology followup.  Since last being seen in our clinic the patient reports doing well. He has been struggling with a URI for the past couple of weeks.  he denies chest pain, palpitations, dyspnea, PND, orthopnea, nausea, vomiting, dizziness, syncope, edema, weight gain, or early satiety. He has not had ICD shocks.   Device History: Device History: [employer] dual chamber ICD implanted 01/31/20, ([employer]), primary prevention  Past Medical History:  Diagnosis Date   CHF (congestive heart failure) (HCC)    COPD (chronic obstructive pulmonary disease) (HCC)    Diabetes mellitus without complication (HCC)    Hypertension    No past surgical history on file.  Current Outpatient Medications  Medication Sig Dispense Refill   acetaminophen (TYLENOL) 325 MG tablet Take 1 tablet (325 mg total) by mouth every 6 (six) hours as needed for moderate pain, mild pain, fever or headache. 30 tablet 0   amLODipine (NORVASC) 10 MG tablet Take 1 tablet (10 mg total) by mouth daily. 90 tablet 1   aspirin EC 81 MG tablet Take 81 mg by mouth daily.     atorvastatin (LIPITOR) 40 MG tablet Take 40 mg by mouth daily.     carvedilol (COREG) 12.5 MG tablet Take 3 tablets (37.5 mg total) by mouth 2 (two) times daily with a meal. 180 tablet 0   emtricitabine-tenofovir ([name]) 200-300 MG tablet Take 1 tablet by mouth daily.     fluticasone (FLONASE) 50 MCG/ACT nasal spray Place 1 spray into both nostrils daily as needed for allergies or rhinitis.     guaifenesin (HUMIBID E) 400 MG TABS tablet Take 800 mg by mouth every 12 (twelve) hours as needed (congestion).     hydrochlorothiazide  (HYDRODIURIL) 50 MG tablet Take 1 tablet (50 mg total) by mouth daily. 30 tablet 0   isosorbide mononitrate (IMDUR) 60 MG 24 hr tablet Take 1 tablet (60 mg total) by mouth daily. 90 tablet 3   losartan (COZAAR) 100 MG tablet Take 1 tablet (100 mg total) by mouth daily. 30 tablet 0   Magnesium Oxide 400 MG CAPS Take 1 capsule (400 mg total) by mouth daily. 90 capsule 3   nicotine polacrilex (NICORETTE) 2 MG gum Take 1 each (2 mg total) by mouth as needed for smoking cessation. 100 tablet 0   ondansetron (ZOFRAN ODT) 4 MG disintegrating tablet Take 1 tablet (4 mg total) by mouth every 8 (eight) hours as needed for nausea or vomiting. 20 tablet 0   No current facility-administered medications for this visit.    Allergies:   Lactose and Lisinopril   Social History: Social History   Socioeconomic History   Marital status: Divorced    Spouse name: Not on file   Number of children: Not on file   Years of education: Not on file   Highest education level: Not on file  Occupational History   Not on file  Tobacco Use   Smoking status: Every Day    Packs/day: 1.00    Types: Cigarettes   Smokeless tobacco: Never  Vaping Use   Vaping Use: Never used  Substance and Sexual Activity   Alcohol use: No   Drug use: Yes  Types: Marijuana   Sexual activity: Not on file  Other Topics Concern   Not on file  Social History Narrative   Not on file   Social Determinants of Health   Financial Resource Strain: Not on file  Food Insecurity: Not on file  Transportation Needs: Not on file  Physical Activity: Not on file  Stress: Not on file  Social Connections: Not on file  Intimate Partner Violence: Not on file    Family History: No family history on file.  Review of Systems: All other systems reviewed and are otherwise negative except as noted above.   Physical Exam: There were no vitals filed for this visit.   GEN- The patient is well appearing, alert and oriented x 3 today.    HEENT: normocephalic, atraumatic; sclera clear, conjunctiva pink; hearing intact; oropharynx clear; neck supple, no JVP Lymph- no cervical lymphadenopathy Lungs- Clear to ausculation bilaterally, normal work of breathing.  No wheezes, rales, rhonchi Heart- Regular rate and rhythm, no murmurs, rubs or gallops, PMI not laterally displaced GI- soft, non-tender, non-distended, bowel sounds present, no hepatosplenomegaly Extremities- no clubbing or cyanosis. No edema; DP/PT/radial pulses 2+ bilaterally MS- no significant deformity or atrophy Skin- warm and dry, no rash or lesion; ICD pocket well healed Psych- euthymic mood, full affect Neuro- strength and sensation are intact  ICD interrogation- reviewed in detail today,  See PACEART report  EKG:  EKG is not ordered today.   Recent Labs: 08/07/2021: B Natriuretic Peptide 325.4 08/16/2021: Magnesium 1.8 09/19/2021: ALT 18 09/25/2021: BUN 13; Creatinine, Ser 1.05; Hemoglobin 13.3; Platelets 195; Potassium 3.5; Sodium 135   Wt Readings from Last 3 Encounters:  09/19/21 180 lb (81.6 kg)  08/16/21 197 lb 6.4 oz (89.5 kg)  08/07/21 192 lb (87.1 kg)     Other studies Reviewed: Additional studies/ records that were reviewed today include: Previous EP office notes.   Assessment and Plan:  1.  Chronic systolic dysfunction s/p [employer] dual chamber ICD  euvolemic today Stable on an appropriate medical regimen Normal ICD function See Pace Art report No changes today  2. HTN Stable on current regimen    3. NSVT/ VT  With recent VT, ATP, and syncope.  Discussed importance of med compliance.  Reviewed [place] guidelines of no driving for 6 months after appropriate ICD therapy. Can drive in February if no further VT He has been out of coreg and HCTz for about 2 weeks. Stressed importance of compliance.   4. DM2 Per PCP  Current medicines are reviewed at length with the patient today.    Labs/ tests ordered today include:  Orders  Placed This Encounter  Procedures   Basic metabolic panel   Magnesium    Disposition:   Follow up with EP APP in 6 months   Signed, Graciella Freer, PA-C  10/10/2021 2:47 PM  [facility] [address] [place] [address] [phone] (office) [phone] (fax)

## 2021-10-11 ENCOUNTER — Ambulatory Visit (INDEPENDENT_AMBULATORY_CARE_PROVIDER_SITE_OTHER): Payer: BC Managed Care – PPO | Admitting: Student

## 2021-10-11 ENCOUNTER — Other Ambulatory Visit: Payer: Self-pay

## 2021-10-11 ENCOUNTER — Encounter: Payer: Self-pay | Admitting: Student

## 2021-10-11 VITALS — BP 178/90 | HR 85 | Ht 73.0 in | Wt 195.6 lb

## 2021-10-11 DIAGNOSIS — Z95810 Presence of automatic (implantable) cardiac defibrillator: Secondary | ICD-10-CM | POA: Diagnosis not present

## 2021-10-11 DIAGNOSIS — I10 Essential (primary) hypertension: Secondary | ICD-10-CM

## 2021-10-11 DIAGNOSIS — I422 Other hypertrophic cardiomyopathy: Secondary | ICD-10-CM

## 2021-10-11 DIAGNOSIS — I4729 Other ventricular tachycardia: Secondary | ICD-10-CM

## 2021-10-11 LAB — CUP PACEART INCLINIC DEVICE CHECK
Date Time Interrogation Session: 20221013091227
HighPow Impedance: 51 Ohm
Implantable Lead Implant Date: 20210201
Implantable Lead Implant Date: 20210201
Implantable Lead Location: 753862
Implantable Lead Location: 753862
Implantable Lead Model: 273
Implantable Lead Model: 7841
Implantable Lead Serial Number: 1044519
Implantable Lead Serial Number: 109249
Implantable Pulse Generator Implant Date: 20210201
Lead Channel Impedance Value: 447 Ohm
Lead Channel Impedance Value: 698 Ohm
Lead Channel Pacing Threshold Amplitude: 0.7 V
Lead Channel Pacing Threshold Amplitude: 0.7 V
Lead Channel Pacing Threshold Pulse Width: 0.4 ms
Lead Channel Pacing Threshold Pulse Width: 0.4 ms
Lead Channel Sensing Intrinsic Amplitude: 19.8 mV
Lead Channel Sensing Intrinsic Amplitude: 4.9 mV
Lead Channel Setting Pacing Amplitude: 2 V
Lead Channel Setting Pacing Amplitude: 2 V
Lead Channel Setting Pacing Pulse Width: 0.4 ms
Lead Channel Setting Sensing Sensitivity: 0.6 mV
Pulse Gen Serial Number: 593625

## 2021-10-11 LAB — BASIC METABOLIC PANEL
BUN/Creatinine Ratio: 23 — ABNORMAL HIGH (ref 9–20)
BUN: 24 mg/dL (ref 6–24)
CO2: 21 mmol/L (ref 20–29)
Calcium: 9.2 mg/dL (ref 8.7–10.2)
Chloride: 103 mmol/L (ref 96–106)
Creatinine, Ser: 1.04 mg/dL (ref 0.76–1.27)
Glucose: 199 mg/dL — ABNORMAL HIGH (ref 70–99)
Potassium: 4.2 mmol/L (ref 3.5–5.2)
Sodium: 137 mmol/L (ref 134–144)
eGFR: 86 mL/min/{1.73_m2} (ref >59)

## 2021-10-11 LAB — MAGNESIUM: Magnesium: 1.8 mg/dL (ref 1.6–2.3)

## 2021-10-11 MED ORDER — HYDROCHLOROTHIAZIDE 50 MG PO TABS: 50.0000 mg | ORAL_TABLET | Freq: Every day | ORAL | 3 refills | Status: AC

## 2021-10-11 MED ORDER — CARVEDILOL 12.5 MG PO TABS: 37.5000 mg | ORAL_TABLET | Freq: Two times a day (BID) | ORAL | 3 refills | Status: AC

## 2021-10-11 NOTE — Patient Instructions (Signed)
Medication Instructions:  Your physician recommends that you continue on your current medications as directed. Please refer to the Current Medication list given to you today.  *If you need a refill on your cardiac medications before your next appointment, please call your pharmacy*   Lab Work: TODAY: BMET, Mag  If you have labs (blood work) drawn today and your tests are completely normal, you will receive your results only by: MyChart Message (if you have MyChart) OR A paper copy in the mail If you have any lab test that is abnormal or we need to change your treatment, we will call you to review the results.   Follow-Up: At [facility], you and your health needs are our priority.  As part of our continuing mission to provide you with exceptional heart care, we have created designated Provider Care Teams.  These Care Teams include your primary Cardiologist (physician) and Advanced Practice Providers (APPs -  Physician Assistants and Nurse Practitioners) who all work together to provide you with the care you need, when you need it.  We recommend signing up for the patient portal called "MyChart".  Sign up information is provided on this After Visit Summary.  MyChart is used to connect with patients for Virtual Visits (Telemedicine).  Patients are able to view lab/test results, encounter notes, upcoming appointments, etc.  Non-urgent messages can be sent to your provider as well.   To learn more about what you can do with MyChart, go to [email or web address].    Your next appointment:   6 month(s)  The format for your next appointment:   In Person  Provider:   You may see Sherryl Manges, MD or one of the following Advanced Practice Providers on your designated Care Team:   Francis Dowse, [place] "[facility]" [place], [place]

## 2021-10-19 IMAGING — CT CT RENAL STONE PROTOCOL
2 of 4 series · 16 of 46 positions shown, 18 images · non-contrast
Comparison: None.

CLINICAL DATA: Lower right-sided abdominal pain.

EXAM:
CT ABDOMEN AND PELVIS WITHOUT CONTRAST
TECHNIQUE: Multidetector CT imaging of the abdomen and pelvis was performed
following the standard protocol without IV contrast.

[Series 3: stone study 5.0 i30f 2 · axial · 0.95mm/px · z∈[+748,+1163]mm · 13 of 91 slices shown, 15 images]
[im 4/91  soft-tissue]
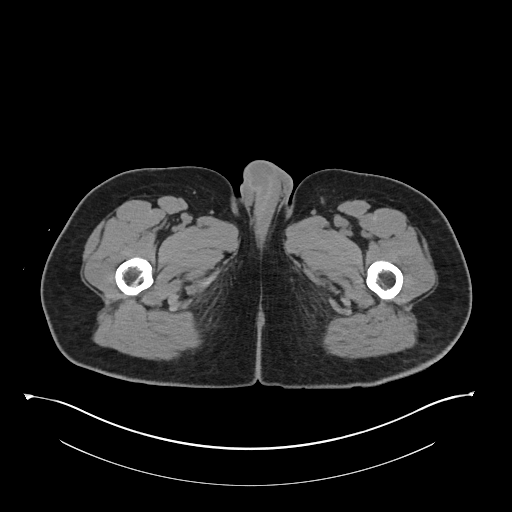
[im 4/91  bone]
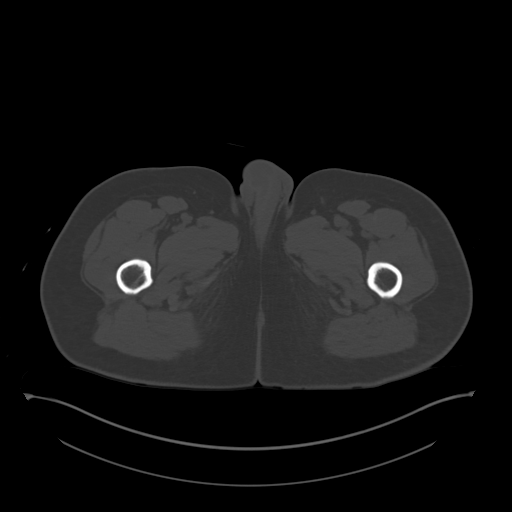
[im 11/91  soft-tissue]
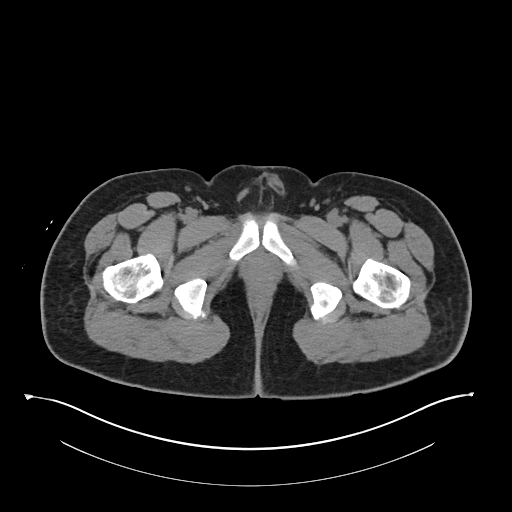
[im 19/91  soft-tissue]
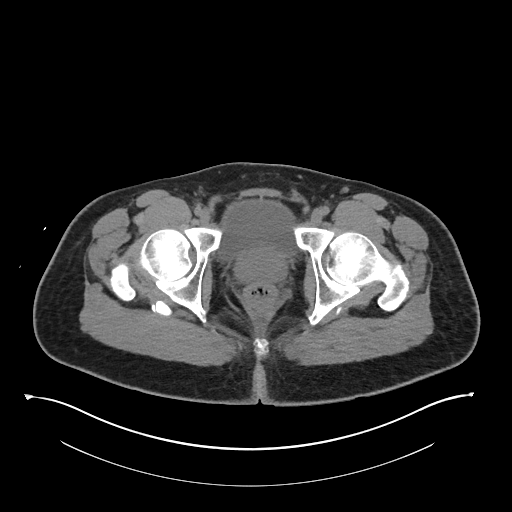
[im 26/91  soft-tissue]
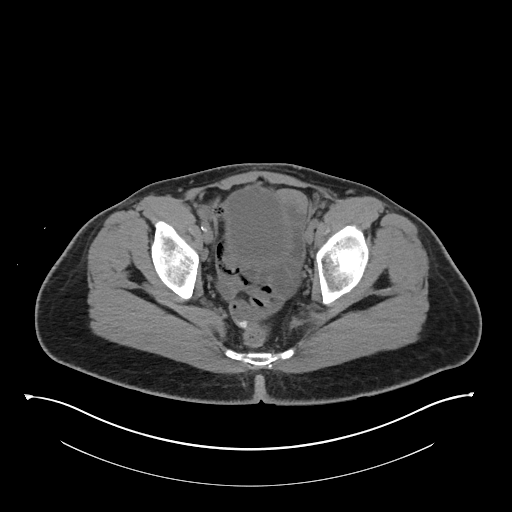
[im 33/91  soft-tissue]
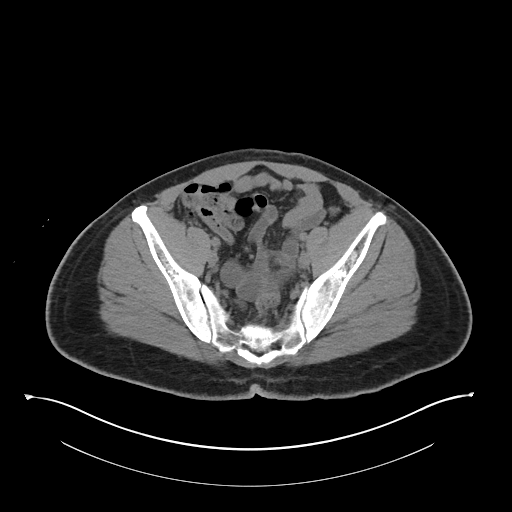
[im 40/91  soft-tissue]
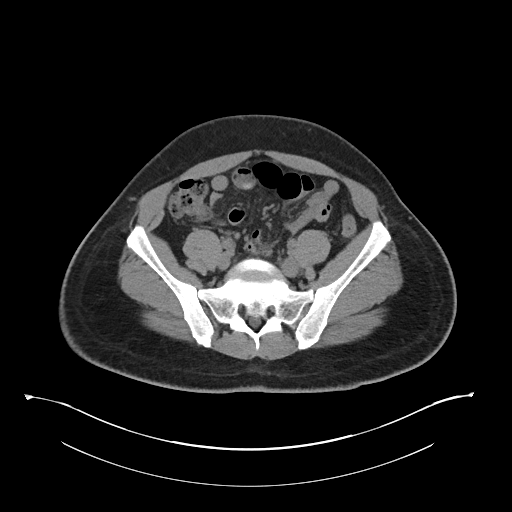
[im 47/91  soft-tissue]
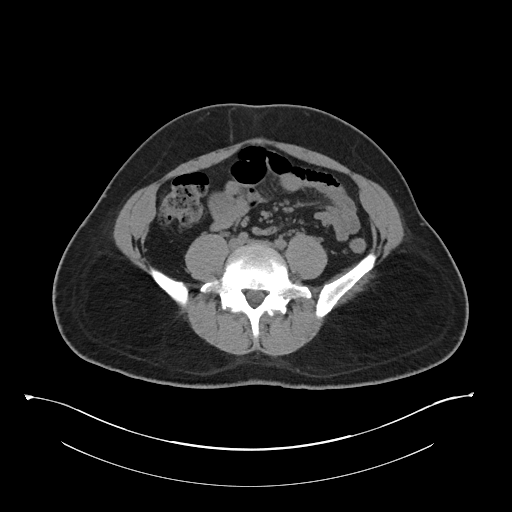
[im 51/91  soft-tissue]
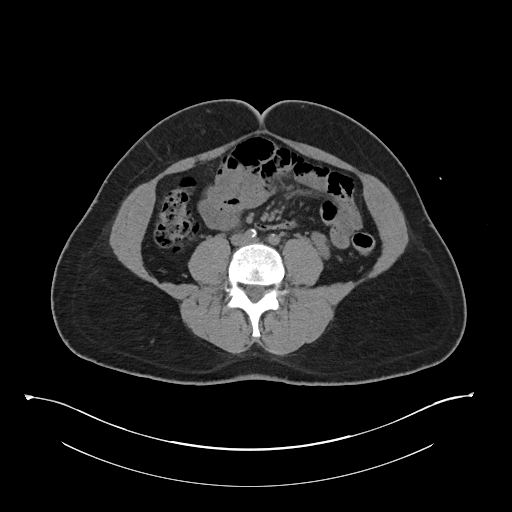
[im 58/91  soft-tissue]
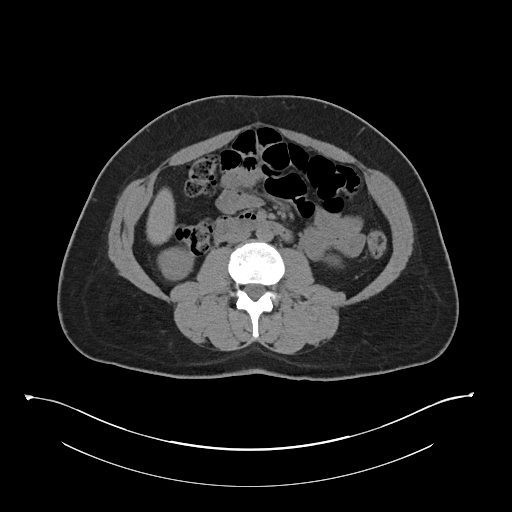
[im 58/91  bone]
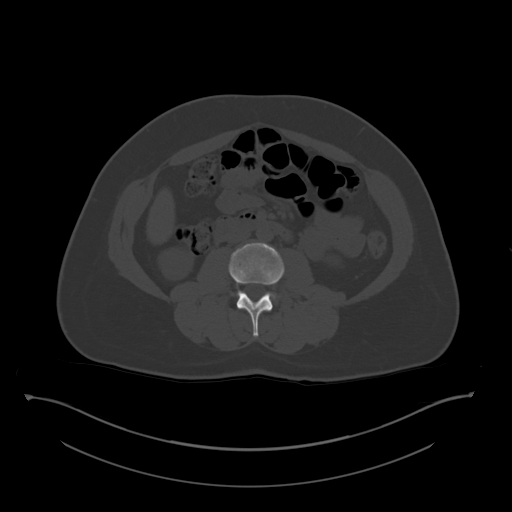
[im 65/91  soft-tissue]
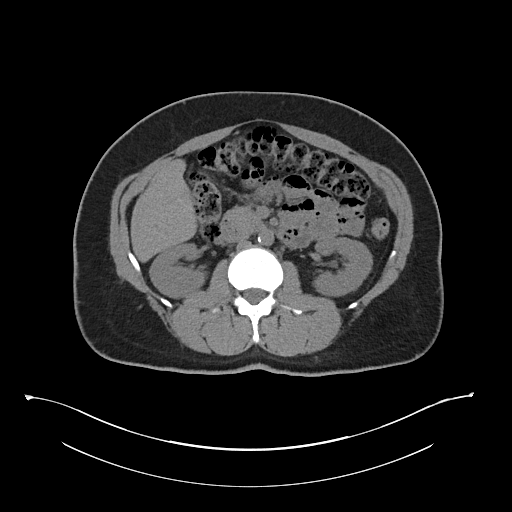
[im 73/91  soft-tissue]
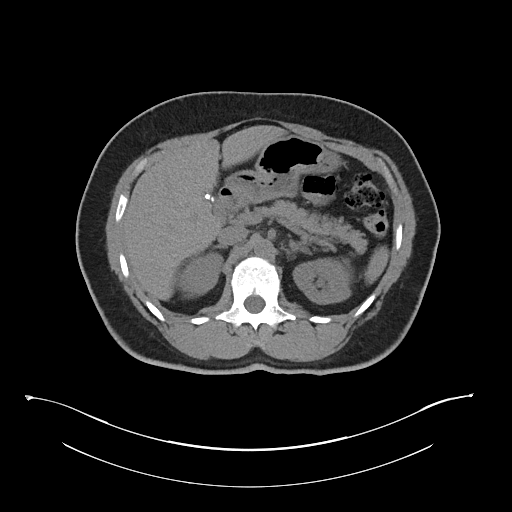
[im 80/91  soft-tissue]
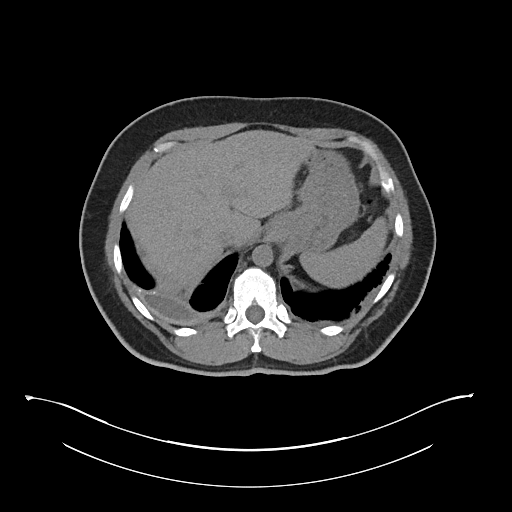
[im 87/91  soft-tissue]
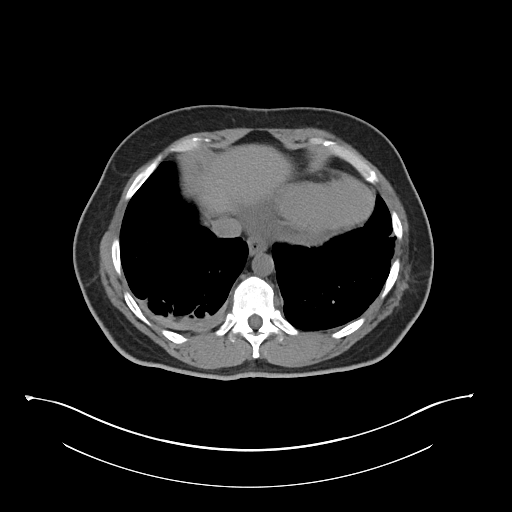

[Series 6: coronal soft tissue · coronal · 0.80mm/px · 3 of 105 slices shown]
[im 35/105  soft-tissue]
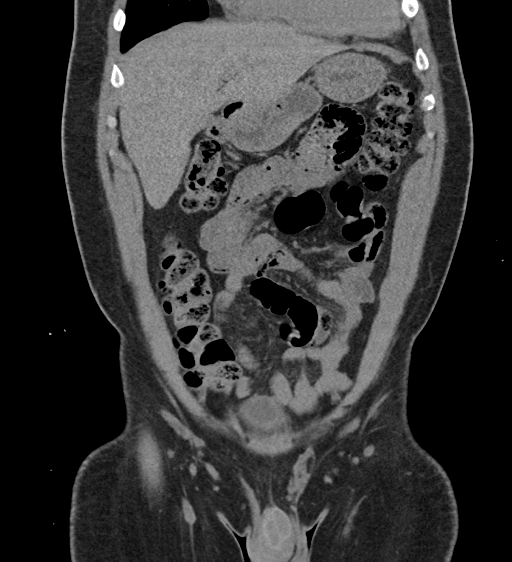
[im 47/105  soft-tissue]
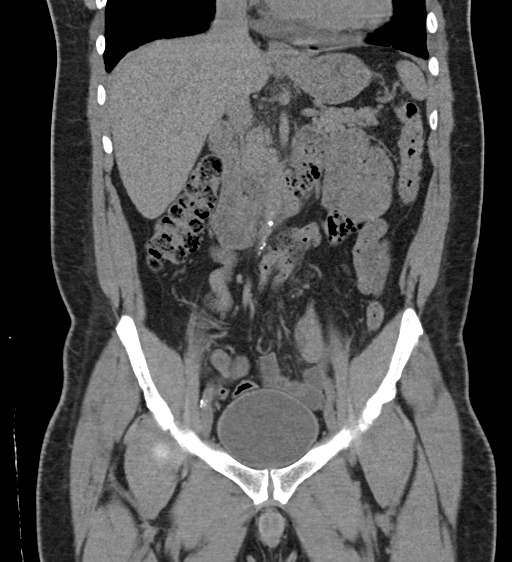
[im 58/105  soft-tissue]
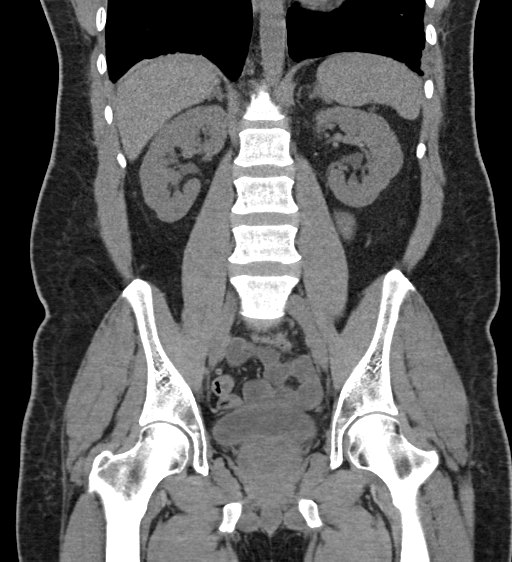

[16 of 46 positions shown; findings below may reference images not displayed]

FINDINGS: Lower chest: Mild scarring and/or atelectasis is seen within the
posterior aspect of the right lung base.

A small, adjacent collection of pleural fluid, with partially
calcified anterior wall, is seen.

Hepatobiliary: No focal liver abnormality is seen. The gallbladder
is not clearly identified. Adjacent 5 mm calcifications are seen
within the superior aspect of the gallbladder fossa, with an
additional 8 mm calcification seen along the inferior aspect of the
gallbladder fossa.

Pancreas: Unremarkable. No pancreatic ductal dilatation or
surrounding inflammatory changes.

Spleen: Normal in size without focal abnormality.

Adrenals/Urinary Tract: Adrenal glands are unremarkable. Kidneys are
normal, without renal calculi, focal lesion, or hydronephrosis.
Bladder is unremarkable.

Stomach/Bowel: Stomach is within normal limits. Appendix appears
normal. No evidence of bowel wall thickening, distention, or
inflammatory changes.

Vascular/Lymphatic: Aortic atherosclerosis. No enlarged abdominal or
pelvic lymph nodes.

Reproductive: The prostate gland is mildly enlarged.

Other: No abdominal wall hernia or abnormality. No abdominopelvic
ascites.

Musculoskeletal: No acute or significant osseous findings.
IMPRESSION: 1. Findings along the posterior aspect of the right lung base likely
consistent with sequelae associated with prior empyema.
2. Poorly visualized gallbladder with small calcifications in the
gallbladder fossa, possibly representing small retained gallstones.
Correlation with the patient's surgical history is recommended to
exclude prior cholecystectomy.
3. Aortic atherosclerosis.
4. Mildly enlarged prostate gland.

Aortic Atherosclerosis ([id number]).

## 2021-10-24 IMAGING — CR DG CHEST 1V
1 series · 1 of 1 positions shown · non-contrast
Comparison: 08/07/2021 CT abdomen 09/19/2021

CLINICAL DATA: Sinus congestion, cough and fever

EXAM:
CHEST  1 VIEW

[chest pa]
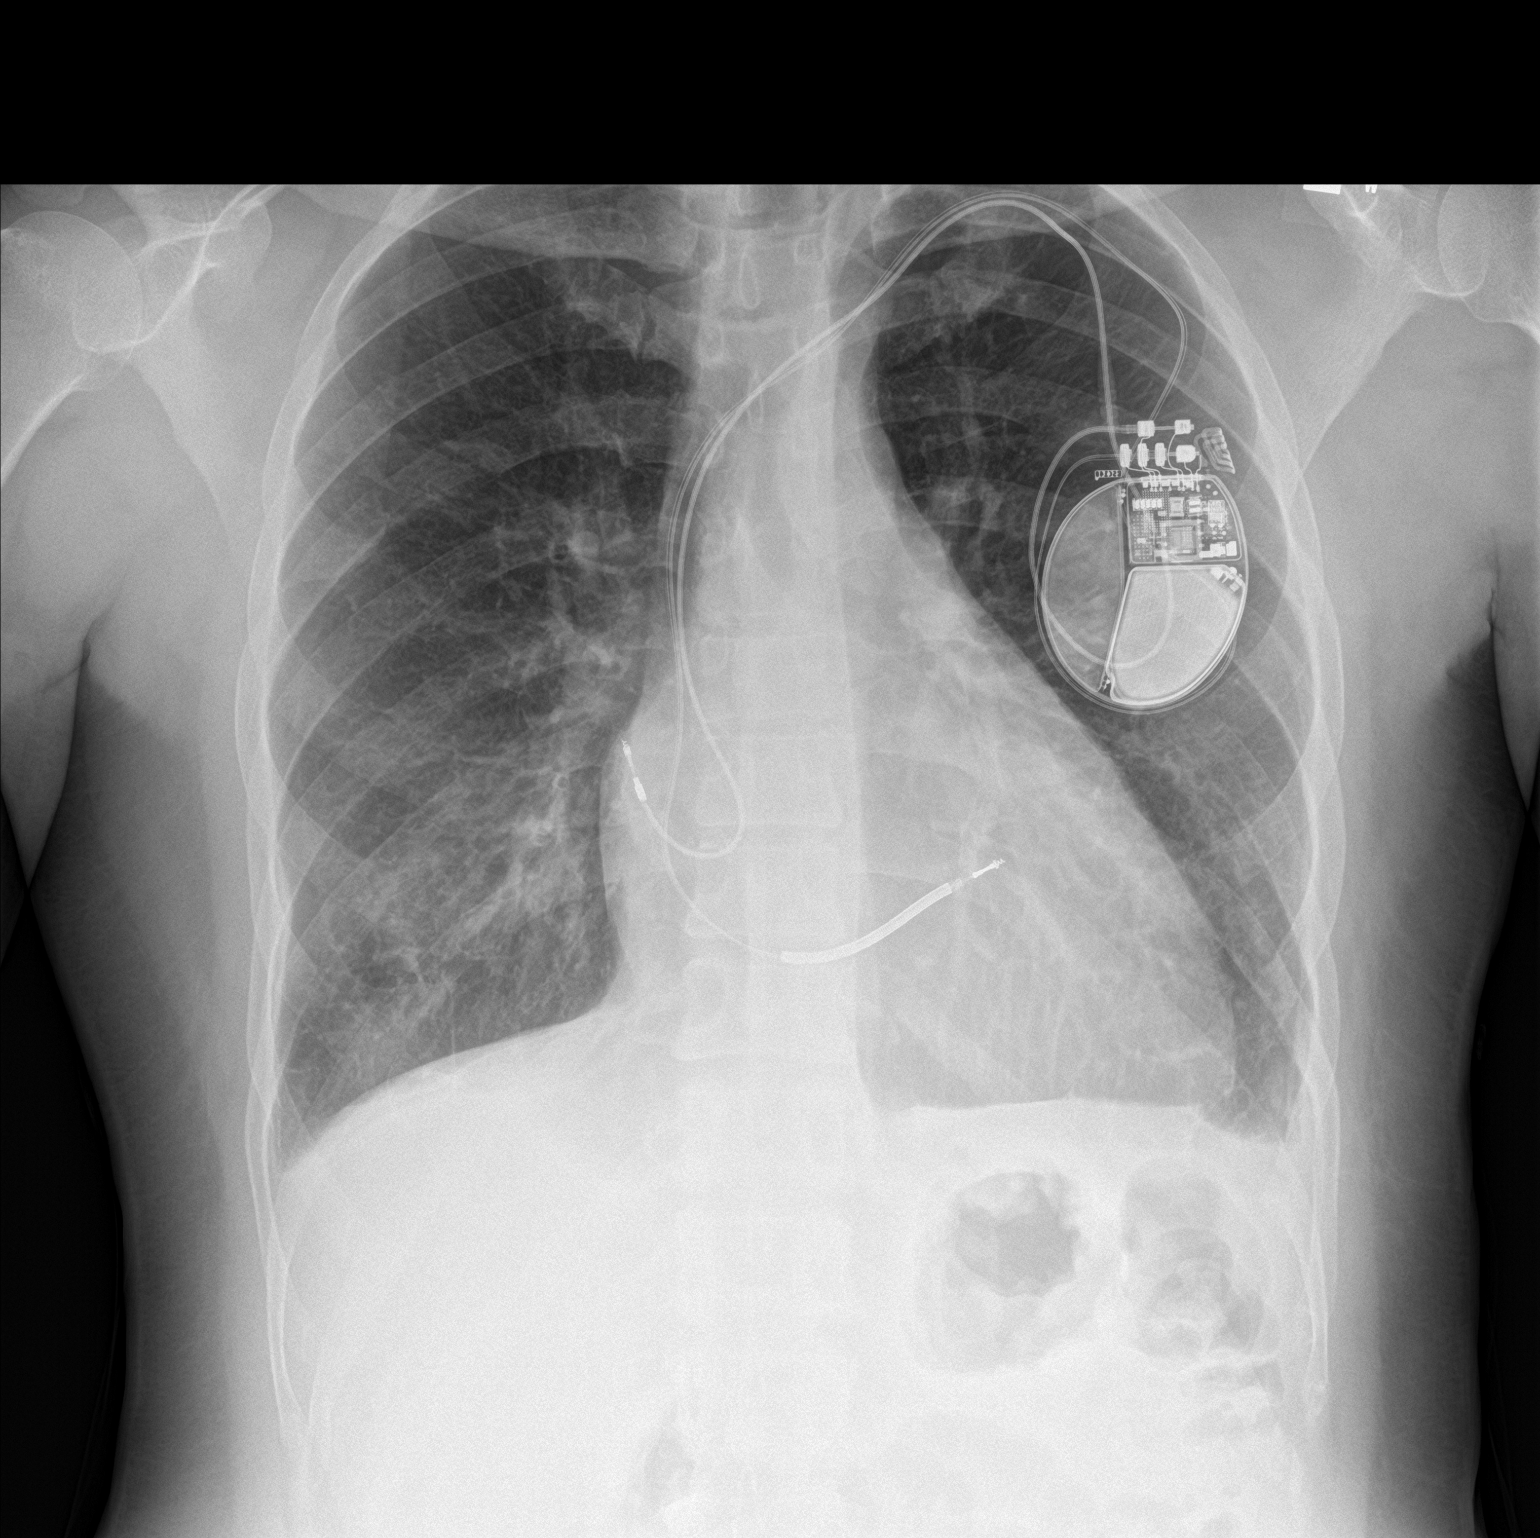

[1 of 1 positions shown; findings below may reference images not displayed]

FINDINGS: LEFT-sided pacemaker overlies normal cardiac silhouette. There is
subtle airspace disease in the RIGHT lower lobe not changed from
prior. Mild blunting of the LEFT costophrenic angle. Pneumothorax.
Upper lungs clear.
IMPRESSION: Chronic RIGHT lower lobe opacity. Findings correlate with scarring
and loculated RIGHT pleural effusion on comparison CT.

Consider nonemergent CT of thorax to fully evaluate lungs.

## 2021-12-07 ENCOUNTER — Emergency Department (HOSPITAL_COMMUNITY): Payer: BC Managed Care – PPO

## 2021-12-07 ENCOUNTER — Emergency Department (HOSPITAL_COMMUNITY)
Admission: EM | Admit: 2021-12-07 | Discharge: 2021-12-07 | Disposition: A | Discharge status: Home / Self Care | Payer: BC Managed Care – PPO | Attending: Emergency Medicine | Admitting: Emergency Medicine

## 2021-12-07 ENCOUNTER — Encounter (HOSPITAL_COMMUNITY): Payer: Self-pay | Admitting: Emergency Medicine

## 2021-12-07 ENCOUNTER — Other Ambulatory Visit: Payer: Self-pay

## 2021-12-07 DIAGNOSIS — Z20822 Contact with and (suspected) exposure to covid-19: Secondary | ICD-10-CM | POA: Diagnosis not present

## 2021-12-07 DIAGNOSIS — M791 Myalgia, unspecified site: Secondary | ICD-10-CM | POA: Diagnosis not present

## 2021-12-07 DIAGNOSIS — R059 Cough, unspecified: Secondary | ICD-10-CM | POA: Diagnosis not present

## 2021-12-07 DIAGNOSIS — R0602 Shortness of breath: Secondary | ICD-10-CM | POA: Diagnosis not present

## 2021-12-07 DIAGNOSIS — I517 Cardiomegaly: Secondary | ICD-10-CM | POA: Diagnosis not present

## 2021-12-07 DIAGNOSIS — B349 Viral infection, unspecified: Secondary | ICD-10-CM | POA: Diagnosis not present

## 2021-12-07 LAB — CBC WITH DIFFERENTIAL/PLATELET
Abs Immature Granulocytes: 0.01 10*3/uL (ref 0.00–0.07)
Basophils Absolute: 0 10*3/uL (ref 0.0–0.1)
Basophils Relative: 0 %
Eosinophils Absolute: 0 10*3/uL (ref 0.0–0.5)
Eosinophils Relative: 1 %
HCT: 38.2 % — ABNORMAL LOW (ref 39.0–52.0)
Hemoglobin: 12.7 g/dL — ABNORMAL LOW (ref 13.0–17.0)
Immature Granulocytes: 0 %
Lymphocytes Relative: 30 %
Lymphs Abs: 1.4 10*3/uL (ref 0.7–4.0)
MCH: 32.2 pg (ref 26.0–34.0)
MCHC: 33.2 g/dL (ref 30.0–36.0)
MCV: 97 fL (ref 80.0–100.0)
Monocytes Absolute: 0.4 10*3/uL (ref 0.1–1.0)
Monocytes Relative: 7 %
Neutro Abs: 2.9 10*3/uL (ref 1.7–7.7)
Neutrophils Relative %: 62 %
Platelets: 206 10*3/uL (ref 150–400)
RBC: 3.94 MIL/uL — ABNORMAL LOW (ref 4.22–5.81)
RDW: 12.9 % (ref 11.5–15.5)
WBC: 4.7 10*3/uL (ref 4.0–10.5)
nRBC: 0 % (ref 0.0–0.2)

## 2021-12-07 LAB — RESP PANEL BY RT-PCR (FLU A&B, COVID) ARPGX2
Influenza A by PCR: NEGATIVE
Influenza B by PCR: NEGATIVE
SARS Coronavirus 2 by RT PCR: NEGATIVE

## 2021-12-07 NOTE — ED Triage Notes (Signed)
Pt here for chest congestion, generalized body aches and fevers/chills x1 week. Pt has not gotten tested for flu/covid, no known sick contacts.

## 2021-12-07 NOTE — ED Provider Notes (Signed)
[facility] EMERGENCY DEPARTMENT Provider Note  CSN: [id number] Arrival date & time: 12/07/21 1101    History Chief Complaint  Patient presents with   Generalized Body Aches    Johnny Schroeder is a 55 y.o. male reports he has had cough, body aches and general malaise for about a week but worse since yesterday. No fevers. Works at a school, so numerous sick contacts. Concerned mostly about flu.    Past Medical History:  Diagnosis Date   CHF (congestive heart failure) (HCC)    COPD (chronic obstructive pulmonary disease) (HCC)    Diabetes mellitus without complication (HCC)    Hypertension     History reviewed. No pertinent surgical history.  History reviewed. No pertinent family history.  Social History   Tobacco Use   Smoking status: Every Day    Packs/day: 1.00    Types: Cigarettes   Smokeless tobacco: Never  Vaping Use   Vaping Use: Never used  Substance Use Topics   Alcohol use: No   Drug use: Yes    Types: Marijuana     Home Medications Prior to Admission medications   Medication Sig Start Date End Date Taking? Authorizing Provider  acetaminophen (TYLENOL) 325 MG tablet Take 1 tablet (325 mg total) by mouth every 6 (six) hours as needed for moderate pain, mild pain, fever or headache. 08/09/21   Azucena Fallen, MD  amLODipine (NORVASC) 10 MG tablet Take 1 tablet (10 mg total) by mouth daily. 08/10/21   Duke Salvia, MD  aspirin EC 81 MG tablet Take 81 mg by mouth daily.    [provider]  atorvastatin (LIPITOR) 40 MG tablet Take 40 mg by mouth daily. 03/03/20   [provider]  carvedilol (COREG) 12.5 MG tablet Take 3 tablets (37.5 mg total) by mouth 2 (two) times daily with a meal. 10/11/21   Tillery, Mariam Dollar, PA-C  emtricitabine-tenofovir ([name]) 200-300 MG tablet Take 1 tablet by mouth daily. 06/02/20   [provider]  emtricitabine-tenofovir AF ([name]) 200-25 MG tablet Take 1 tablet by mouth daily.    [provider]  fluticasone (FLONASE) 50 MCG/ACT nasal spray Place 1 spray into both nostrils daily as needed for allergies or rhinitis.    [provider]  guaifenesin (HUMIBID E) 400 MG TABS tablet Take 800 mg by mouth every 12 (twelve) hours as needed (congestion).    [provider]  hydrochlorothiazide (HYDRODIURIL) 50 MG tablet Take 1 tablet (50 mg total) by mouth daily. 10/11/21   Graciella Freer, PA-C  isosorbide mononitrate (IMDUR) 60 MG 24 hr tablet Take 1 tablet (60 mg total) by mouth daily. 03/16/21 03/16/22  Graciella Freer, PA-C  losartan (COZAAR) 100 MG tablet Take 1 tablet (100 mg total) by mouth daily. 08/09/21   Azucena Fallen, MD  Magnesium Oxide 400 MG CAPS Take 1 capsule (400 mg total) by mouth daily. 08/17/21   Graciella Freer, PA-C  nicotine polacrilex (NICORETTE) 2 MG gum Take 1 each (2 mg total) by mouth as needed for smoking cessation. 08/09/21   Azucena Fallen, MD  ondansetron (ZOFRAN ODT) 4 MG disintegrating tablet Take 1 tablet (4 mg total) by mouth every 8 (eight) hours as needed for nausea or vomiting. 09/19/21   Gerhard Munch, MD     Allergies    Lactose and Lisinopril   Review of Systems   Review of Systems A comprehensive review of systems was completed and negative except as noted in HPI.  Physical Exam BP 118/62   Pulse 75   Temp 98.4 F (36.9 C) (Oral)   Resp 15   SpO2 98%   Physical Exam Vitals and nursing note reviewed.  Constitutional:      Appearance: Normal appearance.  HENT:     Head: Normocephalic and atraumatic.     Nose: Nose normal.     Mouth/Throat:     Mouth: Mucous membranes are moist.  Eyes:     Extraocular Movements: Extraocular movements intact.     Conjunctiva/sclera: Conjunctivae normal.  Cardiovascular:     Rate and Rhythm: Normal rate.  Pulmonary:     Effort: Pulmonary effort is normal.     Breath sounds: Normal breath sounds.  Abdominal:     General: Abdomen is  flat.     Palpations: Abdomen is soft.     Tenderness: There is no abdominal tenderness.  Musculoskeletal:        General: No swelling. Normal range of motion.     Cervical back: Neck supple.  Skin:    General: Skin is warm and dry.  Neurological:     General: No focal deficit present.     Mental Status: He is alert.  Psychiatric:        Mood and Affect: Mood normal.     ED Results / Procedures / Treatments   Labs (all labs ordered are listed, but only abnormal results are displayed) Labs Reviewed  CBC WITH DIFFERENTIAL/PLATELET - Abnormal; Notable for the following components:      Result Value   RBC 3.94 (*)    Hemoglobin 12.7 (*)    HCT 38.2 (*)    All other components within normal limits  RESP PANEL BY RT-PCR (FLU A&B, COVID) [id number]    EKG None  Radiology DG Chest 2 View  Result Date: 12/07/2021 CLINICAL DATA:  Shortness of breath EXAM: CHEST - 2 VIEW COMPARISON:  Previous studies including CT done on 09/25/2021 FINDINGS: Transverse diameter of heart is increased. Pacemaker/defibrillator battery is seen in the left infraclavicular region with tips of leads in the right atrium and right ventricle. Lung fields are clear of any focal consolidation or pulmonary edema. There is blunting of lateral CP angles. There is no pneumothorax. IMPRESSION: Cardiomegaly. There are no signs of pulmonary edema or focal pulmonary consolidation. There is blunting of posterior and lateral costophrenic angles suggesting small pleural effusion and possibly pleural thickening Electronically Signed   By: Ernie Avena M.D.   On: 12/07/2021 11:45    Procedures Procedures  Medications Ordered in the ED Medications - No data to display   MDM Rules/Calculators/A&P MDM Patient with HTN, DM, CHF here with flu-like symptoms. No symptoms of CHF or fluid overload. Overall well appearing in no distress. CXR without signs of infiltrate. Awaiting Covid/flu swab.   ED Course  I have reviewed the  triage vital signs and the nursing notes.  Pertinent labs & imaging results that were available during my care of the patient were reviewed by me and considered in my medical decision making (see chart for details).  Clinical Course as of 12/07/21 1328  Fri Dec 07, 2021  1212 CBC is unremarkable.  [CS]  1317 Covid is neg. Likely a nonspecific viral URI. Recommend OTC symptomatic treatment. PCP follow up. RTED for any other concerns.  [CS]    Clinical Course User Index [CS] Pollyann Savoy, MD    Final Clinical Impression(s) / ED Diagnoses Final diagnoses:  Viral illness    Rx /  DC Orders ED Discharge Orders     None        Pollyann Savoy, MD 12/07/21 1328

## 2021-12-07 NOTE — ED Provider Notes (Signed)
Emergency Medicine Provider Triage Evaluation Note  Johnny Schroeder , a 55 y.o. male  was evaluated in triage.  Pt complains of flu like symptoms for the last week. Patient reports he began feeling ill a week ago but yesterday the symptoms became consistent throughout the day. Patient works at [employer] and has had positive sick contacts. Patient reports he smokes 1PPD and has bronchitis.  Review of Systems  Positive: Fever, productive cough, sore throat, SOB, congestion, vomiting, nausea Negative: Diarrhea, abdominal pain  Physical Exam  BP 124/68   Pulse 70   Temp 98.4 F (36.9 C) (Oral)   Resp 16   SpO2 98%  Gen:   Awake, no distress   Resp:  Normal effort  MSK:   Moves extremities without difficulty  Other:  CTAB  Medical Decision Making  Medically screening exam initiated at 11:17 AM.  Appropriate orders placed.  Perfecto Purdy was informed that the remainder of the evaluation will be completed by another provider, this initial triage assessment does not replace that evaluation, and the importance of remaining in the ED until their evaluation is complete.     Al Decant, PA-C 12/07/21 1118    Terald Sleeper, MD 12/07/21 [phone]

## 2022-02-04 ENCOUNTER — Ambulatory Visit: Payer: BC Managed Care – PPO

## 2022-02-19 ENCOUNTER — Emergency Department (HOSPITAL_COMMUNITY)
Admission: EM | Admit: 2022-02-19 | Discharge: 2022-02-19 | Disposition: A | Discharge status: Home / Self Care | Payer: Self-pay | Attending: Emergency Medicine | Admitting: Emergency Medicine

## 2022-02-19 ENCOUNTER — Other Ambulatory Visit: Payer: Self-pay

## 2022-02-19 ENCOUNTER — Emergency Department (HOSPITAL_COMMUNITY): Payer: Self-pay

## 2022-02-19 DIAGNOSIS — J329 Chronic sinusitis, unspecified: Secondary | ICD-10-CM

## 2022-02-19 DIAGNOSIS — Z79899 Other long term (current) drug therapy: Secondary | ICD-10-CM | POA: Insufficient documentation

## 2022-02-19 DIAGNOSIS — J3489 Other specified disorders of nose and nasal sinuses: Secondary | ICD-10-CM | POA: Insufficient documentation

## 2022-02-19 DIAGNOSIS — Z7982 Long term (current) use of aspirin: Secondary | ICD-10-CM | POA: Insufficient documentation

## 2022-02-19 DIAGNOSIS — R519 Headache, unspecified: Secondary | ICD-10-CM | POA: Diagnosis not present

## 2022-02-19 MED ORDER — PROCHLORPERAZINE MALEATE 5 MG PO TABS
10.0000 mg | ORAL_TABLET | Freq: Once | ORAL | Status: AC
  Administered 2022-02-19: 10 mg via ORAL
  Filled 2022-02-19: qty 2

## 2022-02-19 MED ORDER — FLUTICASONE PROPIONATE 50 MCG/ACT NA SUSP: 2.0000 | Freq: Every day | NASAL | 0 refills | Status: AC

## 2022-02-19 MED ORDER — DIPHENHYDRAMINE HCL 25 MG PO CAPS
25.0000 mg | ORAL_CAPSULE | Freq: Once | ORAL | Status: AC
  Administered 2022-02-19: 25 mg via ORAL
  Filled 2022-02-19: qty 1

## 2022-02-19 NOTE — ED Provider Notes (Signed)
[facility] EMERGENCY DEPARTMENT Provider Note   CSN: [id number] Arrival date & time: 02/19/22  1254     History  Chief Complaint  Patient presents with   Eye Pain   Nausea    Johnny Schroeder is a 56 y.o. male.  56 y.o male with no PMH presents to the ED with a chief complaint of left eye pain that began 4 days ago.  Patient describes a feeling of pressure around the top of his eye, maxillary area.  Reports this is worsen this evening with inspiration.  Did take some congestions last night, and there was some improvement in his symptoms.  He also reports a mild headache, localized to the left side of his head.  He denies any prior history of headaches.  Denies any vision changes, weakness, numbness, speech changes.  The history is provided by the patient and medical records.  Eye Pain This is a new problem. The current episode started more than 2 days ago. The problem occurs constantly. The problem has been gradually worsening. Associated symptoms include headaches. Pertinent negatives include no abdominal pain and no shortness of breath. Nothing aggravates the symptoms. The symptoms are relieved by medications. The treatment provided mild relief.      Home Medications Prior to Admission medications   Medication Sig Start Date End Date Taking? Authorizing Provider  acetaminophen (TYLENOL) 325 MG tablet Take 1 tablet (325 mg total) by mouth every 6 (six) hours as needed for moderate pain, mild pain, fever or headache. 08/09/21   Azucena Fallen, MD  amLODipine (NORVASC) 10 MG tablet Take 1 tablet (10 mg total) by mouth daily. 08/10/21   Duke Salvia, MD  aspirin EC 81 MG tablet Take 81 mg by mouth daily.    [provider]  atorvastatin (LIPITOR) 40 MG tablet Take 40 mg by mouth daily. 03/03/20   [provider]  carvedilol (COREG) 12.5 MG tablet Take 3 tablets (37.5 mg total) by mouth 2 (two) times daily with a meal. 10/11/21   Tillery, Mariam Dollar, PA-C  emtricitabine-tenofovir ([name]) 200-300 MG tablet Take 1 tablet by mouth daily. 06/02/20   [provider]  emtricitabine-tenofovir AF ([name]) 200-25 MG tablet Take 1 tablet by mouth daily.    [provider]  fluticasone (FLONASE) 50 MCG/ACT nasal spray Place 1 spray into both nostrils daily as needed for allergies or rhinitis.    [provider]  guaifenesin (HUMIBID E) 400 MG TABS tablet Take 800 mg by mouth every 12 (twelve) hours as needed (congestion).    [provider]  hydrochlorothiazide (HYDRODIURIL) 50 MG tablet Take 1 tablet (50 mg total) by mouth daily. 10/11/21   Graciella Freer, PA-C  isosorbide mononitrate (IMDUR) 60 MG 24 hr tablet Take 1 tablet (60 mg total) by mouth daily. 03/16/21 03/16/22  Graciella Freer, PA-C  losartan (COZAAR) 100 MG tablet Take 1 tablet (100 mg total) by mouth daily. 08/09/21   Azucena Fallen, MD  Magnesium Oxide 400 MG CAPS Take 1 capsule (400 mg total) by mouth daily. 08/17/21   Graciella Freer, PA-C  nicotine polacrilex (NICORETTE) 2 MG gum Take 1 each (2 mg total) by mouth as needed for smoking cessation. 08/09/21   Azucena Fallen, MD  ondansetron (ZOFRAN ODT) 4 MG disintegrating tablet Take 1 tablet (4 mg total) by mouth every 8 (eight) hours as needed for nausea or vomiting. 09/19/21   Gerhard Munch, MD      Allergies  Lactose and Lisinopril    Review of Systems   Review of Systems  Constitutional:  Negative for chills and fever.  HENT:  Positive for sinus pressure.   Eyes:  Positive for pain.  Respiratory:  Negative for shortness of breath.   Gastrointestinal:  Negative for abdominal pain, nausea and vomiting.  Genitourinary:  Negative for flank pain.  Musculoskeletal:  Negative for back pain.  Neurological:  Positive for headaches.  All other systems reviewed and are negative.  Physical Exam Updated Vital Signs BP (!) 166/75    Pulse (!) 59    Temp  98.2 F (36.8 C) (Oral)    Resp (!) 23    SpO2 99%  Physical Exam Vitals and nursing note reviewed.  Constitutional:      Appearance: Normal appearance.  HENT:     Head: Normocephalic and atraumatic.     Nose:     Right Sinus: No maxillary sinus tenderness or frontal sinus tenderness.     Left Sinus: Maxillary sinus tenderness and frontal sinus tenderness present.  Eyes:     Extraocular Movements:     Right eye: Normal extraocular motion and no nystagmus.     Left eye: Normal extraocular motion and no nystagmus.     Pupils: Pupils are equal, round, and reactive to light.  Cardiovascular:     Rate and Rhythm: Normal rate.  Pulmonary:     Effort: Pulmonary effort is normal.     Breath sounds: No wheezing.  Abdominal:     General: Abdomen is flat.  Musculoskeletal:     Cervical back: Normal range of motion and neck supple.  Skin:    General: Skin is warm and dry.  Neurological:     Mental Status: He is alert and oriented to person, place, and time.     Comments: Alert, oriented, thought content appropriate. Speech fluent without evidence of aphasia. Able to follow 2 step commands without difficulty.  Cranial Nerves:  II:  Peripheral visual fields grossly normal, pupils, round, reactive to light III,IV, VI: ptosis not present, extra-ocular motions intact bilaterally  V,VII: smile symmetric, facial light touch sensation equal VIII: hearing grossly normal bilaterally  IX,X: midline uvula rise  XI: bilateral shoulder shrug equal and strong XII: midline tongue extension  Motor:  5/5 in upper and lower extremities bilaterally including strong and equal grip strength and dorsiflexion/plantar flexion Sensory: light touch normal in all extremities.  Cerebellar: normal finger-to-nose with bilateral upper extremities, pronator drift negative Gait: normal gait and balance      ED Results / Procedures / Treatments   Labs (all labs ordered are listed, but only abnormal results are  displayed) Labs Reviewed - No data to display  EKG None  Radiology No results found.  Procedures Procedures    Medications Ordered in ED Medications  diphenhydrAMINE (BENADRYL) capsule 25 mg (25 mg Oral Given 02/19/22 1436)  prochlorperazine (COMPAZINE) tablet 10 mg (10 mg Oral Given 02/19/22 1436)    ED Course/ Medical Decision Making/ A&P                           Medical Decision Making Risk Prescription drug management.   Patient presents to the ED with a chief complaint of left facial pain that began 4 days ago.  Has taken some decongestions with some improvement in his symptoms.  Does have some pressure along the left frontal, maxillary sinuses.  Normal EOMs, no vision changes, was endorsing a  headache and triage.  My evaluation he is overall neurologically intact, ambulatory in the ED with a steady gait.  Vitals are within normal limits he is not tachycardic, no hypoxia.  Denies any chest pain, or shortness of breath.  Did have some nausea prior to coming in and reports that he has not been eating anything today.  CT was ordered in triage, his exam is pending.  You feel that this is more likely sinusitis, upper respiratory infection.  Patient given Benadryl, Compazine to help with headache.  Patient care signed out to incoming provider.   Portions of this note were generated with Scientist, clinical (histocompatibility and immunogenetics). Dictation errors may occur despite best attempts at proofreading.   Final Clinical Impression(s) / ED Diagnoses Final diagnoses:  Sinus pressure    Rx / DC Orders ED Discharge Orders     None         Claude Manges, PA-C 02/19/22 1517    Gwyneth Sprout, MD 02/22/22 [phone]

## 2022-02-19 NOTE — ED Triage Notes (Signed)
Ppt. Stated, ive had some eye burning and stinging since Friday . My nausea started last night.

## 2022-02-19 NOTE — ED Provider Triage Note (Signed)
Emergency Medicine Provider Triage Evaluation Note  Johnny Schroeder , a 56 y.o. male  was evaluated in triage.  Pt complains of left eye pain.  States that the pain started on Friday and feels like it is deep within his left eye.  He initially thought it was a bad headache, however has never had a headache like this before.  He denies any vision changes, weakness, numbness, speech changes.  He denies any eye tearing or eye redness associated with this.  He denies any foreign body sensation or any trauma to his left eye.  Review of Systems  Positive: Eye pain Negative:   Physical Exam  BP (!) 156/73 (BP Location: Right Arm)    Pulse 66    Temp 98.2 F (36.8 C) (Oral)    Resp 14    SpO2 96%  Gen:   Awake, no distress   Resp:  Normal effort  MSK:   Moves extremities without difficulty  Other:  EOMs are intact.  Pupils are round equal and reactive.  Medical Decision Making  Medically screening exam initiated at 1:23 PM.  Appropriate orders placed.  Johnny Schroeder was informed that the remainder of the evaluation will be completed by another provider, this initial triage assessment does not replace that evaluation, and the importance of remaining in the ED until their evaluation is complete.  Ordered head CT given patient is over 50 and has never had this onset of headache before to rule out mass occupying region.  There is no obvious external eye injury that is associated with this.  There is no temporal artery tenderness.   Johnny Birchwood, PA-C 02/19/22 1325

## 2022-02-19 NOTE — Discharge Instructions (Signed)
Please use fluticasone nasal spray in each nostril once daily.  You can use up to 2 sprays per nostril.  Tylenol or ibuprofen for headache.  Please drink plenty of fluids.  Return to the emergency department for worsening symptoms.

## 2022-02-19 NOTE — ED Provider Notes (Signed)
°  Physical Exam  BP (!) 166/75    Pulse (!) 59    Temp 98.2 F (36.8 C) (Oral)    Resp (!) 23    SpO2 99%   Physical Exam Vitals and nursing note reviewed.  Constitutional:      General: He is not in acute distress.    Appearance: Normal appearance.  HENT:     Head: Normocephalic and atraumatic.     Comments: Left frontal and maxillary sinus are tender to palpation.  Right maxillary and frontal sinus are normal. Eyes:     General: Lids are normal.        Right eye: No discharge.        Left eye: No discharge.     Extraocular Movements:     Right eye: Normal extraocular motion.     Left eye: Normal extraocular motion.     Conjunctiva/sclera:     Right eye: Right conjunctiva is not injected.     Left eye: Left conjunctiva is not injected.  Cardiovascular:     Comments: Regular rate and rhythm.  S1/S2 are distinct without any evidence of murmur, rubs, or gallops.  Radial pulses are 2+ bilaterally.  Dorsalis pedis pulses are 2+ bilaterally.  No evidence of pedal edema. Pulmonary:     Comments: Clear to auscultation bilaterally.  Normal effort.  No respiratory distress.  No evidence of wheezes, rales, or rhonchi heard throughout. Abdominal:     General: Abdomen is flat. Bowel sounds are normal. There is no distension.     Tenderness: There is no abdominal tenderness. There is no guarding or rebound.  Musculoskeletal:        General: Normal range of motion.     Cervical back: Neck supple.  Skin:    General: Skin is warm and dry.     Findings: No rash.  Neurological:     General: No focal deficit present.     Mental Status: He is alert.  Psychiatric:        Mood and Affect: Mood normal.        Behavior: Behavior normal.    Procedures  Procedures  ED Course / MDM    Medical Decision Making Risk Prescription drug management.   Accepted handoff at shift change from [facility]. Please see prior provider note for more detail.   Briefly: Patient is 56 y.o. male who presents  to the emergency department with left eye pain and left-sided headache over the last 4 days.  Patient states has been having a lot of pressure in the maxillary and frontal sinus area.  Denies any visual changes, focal weakness, numbness, fever, chills, nausea,, diarrhea.  DDX: concern for sinusitis.  I have low suspicion for orbital cellulitis.  No evidence of proptosis or painful extraocular movements.  Plan: I believe the patient is safe for outpatient follow-up.  Patient feeling better after headache cocktail.  CT head did show evidence of sinusitis.  No obvious intracranial bleeds or mass.  I do agree with the radiologist or potation.  I will prescribe her fluticasone nasal spray.  Is been only going on for few days and does not meet antibiotic criteria.  Strict turn precautions were discussed with the patient.  We will also provide a work note.  He is safe for discharge.      Honor Loh [place], PA-C 02/19/22 1553    Gloris Manchester, MD 02/20/22 1455

## 2022-03-26 ENCOUNTER — Ambulatory Visit (INDEPENDENT_AMBULATORY_CARE_PROVIDER_SITE_OTHER): Payer: BC Managed Care – PPO

## 2022-03-26 DIAGNOSIS — I422 Other hypertrophic cardiomyopathy: Secondary | ICD-10-CM

## 2022-03-26 LAB — CUP PACEART REMOTE DEVICE CHECK
Battery Remaining Longevity: 132 mo
Battery Remaining Percentage: 100 %
Brady Statistic RA Percent Paced: 2 %
Brady Statistic RV Percent Paced: 0 %
Date Time Interrogation Session: 20230316100500
HighPow Impedance: 60 Ohm
Implantable Lead Implant Date: 20210201
Implantable Lead Implant Date: 20210201
Implantable Lead Location: 753862
Implantable Lead Location: 753862
Implantable Lead Model: 273
Implantable Lead Model: 7841
Implantable Lead Serial Number: 1044519
Implantable Lead Serial Number: 109249
Implantable Pulse Generator Implant Date: 20210201
Lead Channel Impedance Value: 486 Ohm
Lead Channel Impedance Value: 710 Ohm
Lead Channel Pacing Threshold Amplitude: 0.4 V
Lead Channel Pacing Threshold Amplitude: 0.7 V
Lead Channel Pacing Threshold Pulse Width: 0.4 ms
Lead Channel Pacing Threshold Pulse Width: 0.4 ms
Lead Channel Setting Pacing Amplitude: 2 V
Lead Channel Setting Pacing Amplitude: 2 V
Lead Channel Setting Pacing Pulse Width: 0.4 ms
Lead Channel Setting Sensing Sensitivity: 0.6 mV
Pulse Gen Serial Number: 593625

## 2022-03-27 LAB — CUP PACEART REMOTE DEVICE CHECK
Battery Remaining Longevity: 156 mo
Battery Remaining Percentage: 100 %
Brady Statistic RA Percent Paced: 3 %
Brady Statistic RV Percent Paced: 0 %
Date Time Interrogation Session: 20230208140300
HighPow Impedance: 57 Ohm
Implantable Lead Implant Date: 20210201
Implantable Lead Implant Date: 20210201
Implantable Lead Location: 753862
Implantable Lead Location: 753862
Implantable Lead Model: 273
Implantable Lead Model: 7841
Implantable Lead Serial Number: 1044519
Implantable Lead Serial Number: 109249
Implantable Pulse Generator Implant Date: 20210201
Lead Channel Impedance Value: 464 Ohm
Lead Channel Impedance Value: 692 Ohm
Lead Channel Pacing Threshold Amplitude: 0.4 V
Lead Channel Pacing Threshold Amplitude: 0.7 V
Lead Channel Pacing Threshold Pulse Width: 0.4 ms
Lead Channel Pacing Threshold Pulse Width: 0.4 ms
Lead Channel Setting Pacing Amplitude: 2 V
Lead Channel Setting Pacing Amplitude: 2 V
Lead Channel Setting Pacing Pulse Width: 0.4 ms
Lead Channel Setting Sensing Sensitivity: 0.6 mV
Pulse Gen Serial Number: 593625

## 2022-04-08 NOTE — Progress Notes (Signed)
Remote ICD transmission.   

## 2022-05-07 ENCOUNTER — Encounter: Payer: Self-pay | Admitting: Internal Medicine

## 2023-05-13 DIAGNOSIS — I21A1 Myocardial infarction type 2: Secondary | ICD-10-CM | POA: Diagnosis not present

## 2023-05-13 DIAGNOSIS — I509 Heart failure, unspecified: Secondary | ICD-10-CM | POA: Diagnosis not present

## 2023-05-13 DIAGNOSIS — Z888 Allergy status to other drugs, medicaments and biological substances status: Secondary | ICD-10-CM | POA: Diagnosis not present

## 2023-05-13 DIAGNOSIS — N189 Chronic kidney disease, unspecified: Secondary | ICD-10-CM | POA: Diagnosis not present

## 2023-05-13 DIAGNOSIS — J441 Chronic obstructive pulmonary disease with (acute) exacerbation: Secondary | ICD-10-CM | POA: Diagnosis not present

## 2023-05-13 DIAGNOSIS — J9601 Acute respiratory failure with hypoxia: Secondary | ICD-10-CM | POA: Diagnosis not present

## 2023-05-13 DIAGNOSIS — F17210 Nicotine dependence, cigarettes, uncomplicated: Secondary | ICD-10-CM | POA: Diagnosis not present

## 2023-05-13 DIAGNOSIS — Z95810 Presence of automatic (implantable) cardiac defibrillator: Secondary | ICD-10-CM | POA: Diagnosis not present

## 2023-05-13 DIAGNOSIS — Z85048 Personal history of other malignant neoplasm of rectum, rectosigmoid junction, and anus: Secondary | ICD-10-CM | POA: Diagnosis not present

## 2023-05-13 DIAGNOSIS — E785 Hyperlipidemia, unspecified: Secondary | ICD-10-CM | POA: Diagnosis not present

## 2023-05-13 DIAGNOSIS — K219 Gastro-esophageal reflux disease without esophagitis: Secondary | ICD-10-CM | POA: Diagnosis not present

## 2023-05-13 DIAGNOSIS — I421 Obstructive hypertrophic cardiomyopathy: Secondary | ICD-10-CM | POA: Diagnosis not present

## 2023-05-13 DIAGNOSIS — E1122 Type 2 diabetes mellitus with diabetic chronic kidney disease: Secondary | ICD-10-CM | POA: Diagnosis not present

## 2023-05-13 DIAGNOSIS — I5033 Acute on chronic diastolic (congestive) heart failure: Secondary | ICD-10-CM | POA: Diagnosis not present

## 2023-05-13 DIAGNOSIS — I130 Hypertensive heart and chronic kidney disease with heart failure and stage 1 through stage 4 chronic kidney disease, or unspecified chronic kidney disease: Secondary | ICD-10-CM | POA: Diagnosis not present

## 2023-05-13 DIAGNOSIS — E1165 Type 2 diabetes mellitus with hyperglycemia: Secondary | ICD-10-CM | POA: Diagnosis not present

## 2023-05-13 DIAGNOSIS — I259 Chronic ischemic heart disease, unspecified: Secondary | ICD-10-CM | POA: Diagnosis not present

## 2023-07-31 DEATH — deceased
